# Patient Record
Sex: Female | Born: 1980 | Race: White | Hispanic: No | Marital: Married | Smoking: Former smoker
Health system: Southern US, Community
[De-identification: ages and names within clinical notes are randomized; demographics above are authoritative.]

## PROBLEM LIST (undated history)

## (undated) DIAGNOSIS — I499 Cardiac arrhythmia, unspecified: Secondary | ICD-10-CM

## (undated) DIAGNOSIS — E039 Hypothyroidism, unspecified: Secondary | ICD-10-CM

## (undated) DIAGNOSIS — K219 Gastro-esophageal reflux disease without esophagitis: Secondary | ICD-10-CM

## (undated) DIAGNOSIS — J45909 Unspecified asthma, uncomplicated: Secondary | ICD-10-CM

## (undated) DIAGNOSIS — D649 Anemia, unspecified: Secondary | ICD-10-CM

## (undated) DIAGNOSIS — E785 Hyperlipidemia, unspecified: Secondary | ICD-10-CM

## (undated) HISTORY — PX: NO PAST SURGERIES: SHX2092

---

## 2016-04-10 NOTE — L&D Delivery Note (Signed)
Delivery Note  First Stage: Labor onset: 10/1 at 0600 Augmentation : pitocin Analgesia /Anesthesia intrapartum: epidural AROM at 0054 - thick particulate MSF (amnioinfusion x1 liter)  Second Stage: Complete dilation at 1141 Onset of pushing at  1200 FHR second stage category 2  Delivery of a viable female at 85 by CNM in LOA position Loose single nuchal cord reduced over head at birth Cord double clamped after cessation of pulsation, cut by FOB Cord blood sample collected    Third Stage: Placenta delivered [facility] intact with 3 VC @ 1316 Placenta disposition: disposal Uterine tone firm / bleeding moderate  Bilateral labial lacerations and right sulcus vaginal laceration identified  Anesthesia for repair: epidural Repair 3-0 vicryl for vaginal repair Est. Blood Loss (mL): 400 with amnioinfusion mixed with blood  Complications: none  Mom to postpartum.  Baby to Couplet care / Skin to Skin.  Newborn: Birth Weight: 7-10 Apgar Scores: 8-9 Feeding planned:breast  Madison Blake CNM, MSN, FACNM 01/10/2017, 1:55 PM

## 2016-05-17 LAB — OB RESULTS CONSOLE ANTIBODY SCREEN: ANTIBODY SCREEN: NEGATIVE

## 2016-05-17 LAB — OB RESULTS CONSOLE HIV ANTIBODY (ROUTINE TESTING): HIV: NONREACTIVE

## 2016-05-17 LAB — OB RESULTS CONSOLE ABO/RH: RH TYPE: POSITIVE

## 2016-05-17 LAB — OB RESULTS CONSOLE RUBELLA ANTIBODY, IGM: Rubella: IMMUNE

## 2016-05-17 LAB — OB RESULTS CONSOLE GC/CHLAMYDIA
Chlamydia: NEGATIVE
Gonorrhea: NEGATIVE

## 2016-05-17 LAB — OB RESULTS CONSOLE HEPATITIS B SURFACE ANTIGEN: HEP B S AG: NEGATIVE

## 2016-05-17 LAB — OB RESULTS CONSOLE GBS: STREP GROUP B AG: NEGATIVE

## 2016-05-17 LAB — OB RESULTS CONSOLE RPR: RPR: NONREACTIVE

## 2016-06-30 ENCOUNTER — Other Ambulatory Visit (HOSPITAL_COMMUNITY): Payer: Self-pay

## 2016-06-30 DIAGNOSIS — Z3402 Encounter for supervision of normal first pregnancy, second trimester: Secondary | ICD-10-CM

## 2016-07-03 ENCOUNTER — Other Ambulatory Visit (HOSPITAL_COMMUNITY): Payer: Self-pay

## 2016-07-03 DIAGNOSIS — Z3402 Encounter for supervision of normal first pregnancy, second trimester: Secondary | ICD-10-CM

## 2016-07-19 ENCOUNTER — Other Ambulatory Visit (HOSPITAL_COMMUNITY): Payer: Self-pay

## 2016-07-19 ENCOUNTER — Ambulatory Visit (HOSPITAL_COMMUNITY)
Admission: RE | Admit: 2016-07-19 | Discharge: 2016-07-19 | Disposition: A | Discharge status: Home / Self Care | Payer: Self-pay | Source: Ambulatory Visit

## 2016-07-19 DIAGNOSIS — Z3402 Encounter for supervision of normal first pregnancy, second trimester: Secondary | ICD-10-CM

## 2016-07-19 DIAGNOSIS — Z3A16 16 weeks gestation of pregnancy: Secondary | ICD-10-CM | POA: Insufficient documentation

## 2016-07-19 DIAGNOSIS — O4402 Placenta previa specified as without hemorrhage, second trimester: Secondary | ICD-10-CM | POA: Insufficient documentation

## 2016-07-19 DIAGNOSIS — Z3A18 18 weeks gestation of pregnancy: Secondary | ICD-10-CM

## 2016-07-19 DIAGNOSIS — Z362 Encounter for other antenatal screening follow-up: Secondary | ICD-10-CM | POA: Insufficient documentation

## 2016-07-19 DIAGNOSIS — O288 Other abnormal findings on antenatal screening of mother: Secondary | ICD-10-CM | POA: Insufficient documentation

## 2016-07-19 DIAGNOSIS — Q046 Congenital cerebral cysts: Secondary | ICD-10-CM | POA: Insufficient documentation

## 2016-07-20 ENCOUNTER — Other Ambulatory Visit (HOSPITAL_COMMUNITY): Payer: Self-pay | Admitting: *Deleted

## 2016-07-20 DIAGNOSIS — Z0489 Encounter for examination and observation for other specified reasons: Secondary | ICD-10-CM

## 2016-07-20 DIAGNOSIS — IMO0002 Reserved for concepts with insufficient information to code with codable children: Secondary | ICD-10-CM

## 2016-07-21 ENCOUNTER — Ambulatory Visit (HOSPITAL_COMMUNITY): Payer: Self-pay

## 2016-08-16 ENCOUNTER — Ambulatory Visit (HOSPITAL_COMMUNITY): Payer: Self-pay

## 2017-01-09 ENCOUNTER — Encounter (HOSPITAL_COMMUNITY): Payer: Self-pay

## 2017-01-09 ENCOUNTER — Inpatient Hospital Stay (HOSPITAL_COMMUNITY)
Admission: AD | Admit: 2017-01-09 | Discharge: 2017-01-11 | DRG: 807 | Disposition: A | Discharge status: Home / Self Care | Payer: Self-pay | Source: Ambulatory Visit | Attending: Obstetrics and Gynecology | Admitting: Obstetrics and Gynecology

## 2017-01-09 DIAGNOSIS — Z3A41 41 weeks gestation of pregnancy: Secondary | ICD-10-CM

## 2017-01-09 DIAGNOSIS — Z87891 Personal history of nicotine dependence: Secondary | ICD-10-CM

## 2017-01-09 MED ORDER — LACTATED RINGERS IV SOLN
500.0000 mL | Freq: Once | INTRAVENOUS | Status: AC
  Administered 2017-01-10: 500 mL via INTRAVENOUS

## 2017-01-09 MED ORDER — FAMOTIDINE IN NACL 20-0.9 MG/50ML-% IV SOLN: 20.0000 mg | Freq: Once | INTRAVENOUS | Status: DC

## 2017-01-09 MED ORDER — EPHEDRINE 5 MG/ML INJ
10.0000 mg | INTRAVENOUS | Status: DC | PRN
  Filled 2017-01-09: qty 2

## 2017-01-09 MED ORDER — OXYTOCIN BOLUS FROM INFUSION
500.0000 mL | Freq: Once | INTRAVENOUS | Status: AC
  Administered 2017-01-10: 500 mL via INTRAVENOUS

## 2017-01-09 MED ORDER — LACTATED RINGERS IV SOLN
INTRAVENOUS | Status: DC
  Administered 2017-01-10: via INTRAVENOUS

## 2017-01-09 MED ORDER — ACETAMINOPHEN 325 MG PO TABS
650.0000 mg | ORAL_TABLET | ORAL | Status: DC | PRN
Start: 2017-01-09 — End: 2017-01-10
  Administered 2017-01-10: 650 mg via ORAL
  Filled 2017-01-09: qty 2

## 2017-01-09 MED ORDER — SOD CITRATE-CITRIC ACID 500-334 MG/5ML PO SOLN: 30.0000 mL | ORAL | Status: DC | PRN

## 2017-01-09 MED ORDER — PHENYLEPHRINE 40 MCG/ML (10ML) SYRINGE FOR IV PUSH (FOR BLOOD PRESSURE SUPPORT)
80.0000 ug | PREFILLED_SYRINGE | INTRAVENOUS | Status: DC | PRN
  Filled 2017-01-09 (×2): qty 10
  Filled 2017-01-09: qty 5

## 2017-01-09 MED ORDER — FENTANYL 2.5 MCG/ML BUPIVACAINE 1/10 % EPIDURAL INFUSION (WH - ANES)
14.0000 mL/h | INTRAMUSCULAR | Status: DC | PRN
  Administered 2017-01-10 (×2): 14 mL/h via EPIDURAL
  Filled 2017-01-09 (×2): qty 100

## 2017-01-09 MED ORDER — LIDOCAINE HCL (PF) 1 % IJ SOLN
30.0000 mL | INTRAMUSCULAR | Status: DC | PRN
  Filled 2017-01-09: qty 30

## 2017-01-09 MED ORDER — OXYCODONE-ACETAMINOPHEN 5-325 MG PO TABS
2.0000 | ORAL_TABLET | ORAL | Status: DC | PRN
Start: 2017-01-09 — End: 2017-01-10

## 2017-01-09 MED ORDER — PHENYLEPHRINE 40 MCG/ML (10ML) SYRINGE FOR IV PUSH (FOR BLOOD PRESSURE SUPPORT)
80.0000 ug | PREFILLED_SYRINGE | INTRAVENOUS | Status: DC | PRN
  Administered 2017-01-10: 80 ug via INTRAVENOUS
  Filled 2017-01-09: qty 5

## 2017-01-09 MED ORDER — OXYCODONE-ACETAMINOPHEN 5-325 MG PO TABS: 1.0000 | ORAL_TABLET | ORAL | Status: DC | PRN

## 2017-01-09 MED ORDER — OXYTOCIN 40 UNITS IN LACTATED RINGERS INFUSION - SIMPLE MED
2.5000 [IU]/h | INTRAVENOUS | Status: DC
  Filled 2017-01-09: qty 1000

## 2017-01-09 MED ORDER — LACTATED RINGERS IV SOLN: 500.0000 mL | INTRAVENOUS | Status: DC | PRN

## 2017-01-09 MED ORDER — DIPHENHYDRAMINE HCL 50 MG/ML IJ SOLN: 12.5000 mg | INTRAMUSCULAR | Status: DC | PRN

## 2017-01-09 MED ORDER — OXYTOCIN 10 UNIT/ML IJ SOLN: 10.0000 [IU] | Freq: Once | INTRAMUSCULAR | Status: DC

## 2017-01-10 ENCOUNTER — Inpatient Hospital Stay (HOSPITAL_COMMUNITY): Payer: Self-pay | Admitting: Anesthesiology

## 2017-01-10 ENCOUNTER — Encounter (HOSPITAL_COMMUNITY): Payer: Self-pay

## 2017-01-10 LAB — CBC
HCT: 33.1 % — ABNORMAL LOW (ref 36.0–46.0)
Hemoglobin: 11.6 g/dL — ABNORMAL LOW (ref 12.0–15.0)
MCH: 29.8 pg (ref 26.0–34.0)
MCHC: 35 g/dL (ref 30.0–36.0)
MCV: 85.1 fL (ref 78.0–100.0)
Platelets: 155 10*3/uL (ref 150–400)
RBC: 3.89 MIL/uL (ref 3.87–5.11)
RDW: 15 % (ref 11.5–15.5)
WBC: 12.1 10*3/uL — ABNORMAL HIGH (ref 4.0–10.5)

## 2017-01-10 LAB — TYPE AND SCREEN
ABO/RH(D): O POS
Antibody Screen: NEGATIVE

## 2017-01-10 LAB — RPR: RPR Ser Ql: NONREACTIVE

## 2017-01-10 LAB — ABO/RH: ABO/RH(D): O POS

## 2017-01-10 MED ORDER — DIBUCAINE 1 % RE OINT: 1.0000 "application " | TOPICAL_OINTMENT | RECTAL | Status: DC | PRN

## 2017-01-10 MED ORDER — TERBUTALINE SULFATE 1 MG/ML IJ SOLN
0.2500 mg | Freq: Once | INTRAMUSCULAR | Status: DC | PRN
  Filled 2017-01-10: qty 1

## 2017-01-10 MED ORDER — SENNOSIDES-DOCUSATE SODIUM 8.6-50 MG PO TABS
2.0000 | ORAL_TABLET | ORAL | Status: DC
  Administered 2017-01-11: 2 via ORAL
  Filled 2017-01-10: qty 2

## 2017-01-10 MED ORDER — BENZOCAINE-MENTHOL 20-0.5 % EX AERO
1.0000 "application " | INHALATION_SPRAY | CUTANEOUS | Status: DC | PRN
  Administered 2017-01-10: 1 via TOPICAL
  Filled 2017-01-10: qty 56

## 2017-01-10 MED ORDER — DIPHENHYDRAMINE HCL 25 MG PO CAPS: 25.0000 mg | ORAL_CAPSULE | Freq: Four times a day (QID) | ORAL | Status: DC | PRN

## 2017-01-10 MED ORDER — LIDOCAINE HCL (PF) 1 % IJ SOLN
INTRAMUSCULAR | Status: DC | PRN
  Administered 2017-01-10 (×2): 5 mL via EPIDURAL

## 2017-01-10 MED ORDER — LACTATED RINGERS IV SOLN
INTRAVENOUS | Status: DC
  Administered 2017-01-10: 08:00:00 via INTRAUTERINE

## 2017-01-10 MED ORDER — ACETAMINOPHEN 325 MG PO TABS: 650.0000 mg | ORAL_TABLET | ORAL | Status: DC | PRN

## 2017-01-10 MED ORDER — COCONUT OIL OIL: 1.0000 "application " | TOPICAL_OIL | Status: DC | PRN

## 2017-01-10 MED ORDER — OXYTOCIN 40 UNITS IN LACTATED RINGERS INFUSION - SIMPLE MED
1.0000 m[IU]/min | INTRAVENOUS | Status: DC
  Administered 2017-01-10: 2 m[IU]/min via INTRAVENOUS

## 2017-01-10 MED ORDER — SIMETHICONE 80 MG PO CHEW: 80.0000 mg | CHEWABLE_TABLET | ORAL | Status: DC | PRN

## 2017-01-10 MED ORDER — IBUPROFEN 600 MG PO TABS
600.0000 mg | ORAL_TABLET | Freq: Four times a day (QID) | ORAL | Status: DC
  Administered 2017-01-10 – 2017-01-11 (×4): 600 mg via ORAL
  Filled 2017-01-10 (×4): qty 1

## 2017-01-10 MED ORDER — WITCH HAZEL-GLYCERIN EX PADS: 1.0000 "application " | MEDICATED_PAD | CUTANEOUS | Status: DC | PRN

## 2017-01-10 NOTE — Anesthesia Preprocedure Evaluation (Signed)
Anesthesia Evaluation  Patient identified by MRN, date of birth, ID band Patient awake    Reviewed: Allergy & Precautions, NPO status , Patient's Chart, lab work & pertinent test results  History of Anesthesia Complications Negative for: history of anesthetic complications  Airway Mallampati: II  TM Distance: >3 FB Neck ROM: Full    Dental no notable dental hx. (+) Dental Advisory Given   Pulmonary former smoker,    Pulmonary exam normal        Cardiovascular negative cardio ROS Normal cardiovascular exam     Neuro/Psych negative neurological ROS  negative psych ROS   GI/Hepatic negative GI ROS, Neg liver ROS,   Endo/Other  negative endocrine ROS  Renal/GU negative Renal ROS  negative genitourinary   Musculoskeletal negative musculoskeletal ROS (+)   Abdominal   Peds negative pediatric ROS (+)  Hematology negative hematology ROS (+)   Anesthesia Other Findings   Reproductive/Obstetrics (+) Pregnancy                             Anesthesia Physical Anesthesia Plan  ASA: II  Anesthesia Plan: Epidural   Post-op Pain Management:    Induction:   PONV Risk Score and Plan:   Airway Management Planned: Natural Airway  Additional Equipment:   Intra-op Plan:   Post-operative Plan:   Informed Consent: I have reviewed the patients History and Physical, chart, labs and discussed the procedure including the risks, benefits and alternatives for the proposed anesthesia with the patient or authorized representative who has indicated his/her understanding and acceptance.   Dental advisory given  Plan Discussed with: Anesthesiologist  Anesthesia Plan Comments:         Anesthesia Quick Evaluation

## 2017-01-10 NOTE — Progress Notes (Signed)
S:  Feeling pressure with ctx  O:  VS: Blood pressure 98/68, pulse 60, temperature 98 F (36.7 C), temperature source Oral, resp. rate 18, height  (1.702 m), weight 76.2 kg (168 lb), SpO2 99 %.        FHR : baseline 125 / variability moderate / accelerations + / no decelerations        Toco: contractions every 2-4 minutes / pitocin 10 mu/min / MVU 180        Cervix : 10cm / 100% / vtx +2        Membranes: thin green fluid - good response from amnioinfusion        Foley - clear straw urine  A: second stage of labor     FHR category 1  P: start active second stage - anticipate SVB      amioinfusion discontinued      Dr Billy Coast updated   Madison Blake CNM, MSN, [facility] 01/10/2017, 11:59 AM

## 2017-01-10 NOTE — Anesthesia Pain Management Evaluation Note (Signed)
  CRNA Pain Management Visit Note  Patient: Madison Blake, 36 y.o., female  "Hello I am a member of the anesthesia team at [facility]. We have an anesthesia team available at all times to provide care throughout the hospital, including epidural management and anesthesia for C-section. I don't know your plan for the delivery whether it a natural birth, water birth, IV sedation, nitrous supplementation, doula or epidural, but we want to meet your pain goals."   1.Was your pain managed to your expectations on prior hospitalizations?   No prior hospitalizations  2.What is your expectation for pain management during this hospitalization?     Epidural  3.How can we help you reach that goal? unsure  Record the patient's initial score and the patient's pain goal.   Pain: 0  Pain Goal: 10 [facility] wants you to be able to say your pain was always managed very well.  Cephus Shelling 01/10/2017

## 2017-01-10 NOTE — Progress Notes (Signed)
S:  comfortable with epidural  O:  VS: Blood pressure 105/64, pulse (!) 58, temperature 98.4 F (36.9 C), temperature source Oral, resp. rate 17, height  (1.702 m), weight 76.2 kg (168 lb), SpO2 99 %.        FHR : baseline 125 / variability moderate / accelerations + / no decelerations        Toco: contractions every 2-6 minutes / moderate to strong         Cervix : 5-6cm / 90% / vtx 0 station        Membranes: BBOW - AROM with moderate thick, green MSF  A: prolonged labor     FHR category 1  P: expectant management       foley      rotate maternal position on peanut - encouraged sleep/rest for few hours until pressure       re-evaluate in 3-4 hours   Marlinda Mike CNM, MSN, [facility] 01/10/2017, 12:59 AM

## 2017-01-10 NOTE — Anesthesia Procedure Notes (Signed)
Epidural Patient location during procedure: OB Start time: 01/10/2017 12:25 AM End time: 01/10/2017 12:48 AM  Staffing Anesthesiologist: Heather Roberts Performed: anesthesiologist   Preanesthetic Checklist Completed: patient identified, site marked, pre-op evaluation, timeout performed, IV checked, risks and benefits discussed and monitors and equipment checked  Epidural Patient position: sitting Prep: DuraPrep Patient monitoring: heart rate, cardiac monitor, continuous pulse ox and blood pressure Approach: midline Location: L2-L3 Injection technique: LOR saline  Needle:  Needle type: Tuohy  Needle gauge: 17 G Needle length: 9 cm Needle insertion depth: 5 cm Catheter size: 20 Guage Catheter at skin depth: 10 cm Test dose: negative and Other  Assessment Events: blood not aspirated, injection not painful, no injection resistance and negative IV test  Additional Notes Informed consent obtained prior to proceeding including risk of failure, 1% risk of PDPH, risk of minor discomfort and bruising.  Discussed rare but serious complications including epidural abscess, permanent nerve injury, epidural hematoma.  Discussed alternatives to epidural analgesia and patient desires to proceed.  Timeout performed pre-procedure verifying patient name, procedure, and platelet count.  Patient tolerated procedure well.

## 2017-01-10 NOTE — Anesthesia Postprocedure Evaluation (Signed)
Anesthesia Post Note  Patient: Madison Blake  Procedure(s) Performed: AN AD HOC LABOR EPIDURAL     Patient location during evaluation: Mother Baby Anesthesia Type: Epidural Level of consciousness: awake and alert and oriented Pain management: pain level controlled Vital Signs Assessment: post-procedure vital signs reviewed and stable Respiratory status: spontaneous breathing and nonlabored ventilation Cardiovascular status: stable Postop Assessment: no headache, patient able to bend at knees, no backache, no apparent nausea or vomiting, epidural receding and adequate PO intake Anesthetic complications: no    Last Vitals:  Vitals:   01/10/17 1446 01/10/17 1522  BP: 118/66 105/61  Pulse: (!) 58 62  Resp: 18 18  Temp:  37.4 C  SpO2:      Last Pain:  Vitals:   01/10/17 1636  TempSrc:   PainSc: 2    Pain Goal: Patients Stated Pain Goal: 2 (01/09/17 2355)               Laban Emperor

## 2017-01-10 NOTE — H&P (Signed)
  OB ADMISSION/ HISTORY & PHYSICAL:  Admission Date: 01/09/2017 11:39 PM  Admit Diagnosis: 41.2 with prolonged latent labor >24 hours  Madison Blake is a 36 y.o. female presenting for prolonged labor with maternal fatigue - desires epidural.  Prenatal History: G1P0   EDC : 12/31/2016, by Other Basis  Prenatal care at [facility]  Primary Ob Provider: Kathi Ludwig Prenatal course uncomplicated  Prenatal Labs: ABO, Rh:  O positive Antibody:  negative Rubella: positive RPR:   NR HBsAg:   negative HIV:   NR GTT: nl GBS:     Medical / Surgical History :  Past medical history: History reviewed. No pertinent past medical history.   Past surgical history:  Past Surgical History:  Procedure Laterality Date  . NO PAST SURGERIES     Family History:  Family History  Problem Relation Age of Onset  . Asthma Mother   . Cancer Maternal Grandmother   . Hypertension Maternal Grandfather     Social History:  reports that she quit smoking about 11 years ago. Her smoking use included Cigarettes. She has quit using smokeless tobacco. She reports that she does not drink alcohol or use drugs.  Allergies: Patient has no known allergies.   Current Medications at time of admission:  Prior to Admission medications   Medication Sig Start Date End Date Taking? Authorizing Provider  diphenhydrAMINE (SOMINEX) 25 MG tablet Take 25 mg by mouth at bedtime as needed for sleep.   Yes [provider]  hydrOXYzine (ATARAX/VISTARIL) 50 MG tablet Take 50 mg by mouth 3 (three) times daily as needed.   Yes [provider]  Prenatal Vit-Fe Fumarate-FA (PRENATAL MULTIVITAMIN) TABS tablet Take 1 tablet by mouth daily at 12 noon.   Yes [provider]  ranitidine (ZANTAC) 300 MG tablet Take 300 mg by mouth at bedtime.   Yes [provider]   Review of Systems: Active FM onset of ctx Monday AM 0600 currently every 2-5 minutes Membranes intact bloody show  present   Physical Exam: VS: General: alert and oriented, appears exhausted and pale Heart: RRR Lungs: Clear lung fields Abdomen: Gravid, soft and non-tender, non-distended / uterus: gravid Extremities: no edema  Genitalia / VE:  5cm / 90% vtx 0 at [facility]  FHR: baseline rate 125 / variability moderate / accelerations + / no decelerations TOCO: Q8-10 x 120 seconds / moderate  Assessment: 41.[redacted]  weeks gestation Prolonged latent stage of labor FHR category 1   Plan:  Admit Epidural AROM Augment as needed  Dr Billy Coast notified of admission / plan of care   Marlinda Mike CNM, MSN, [facility] 01/10/2017, 12:07 AM

## 2017-01-10 NOTE — Progress Notes (Signed)
S:  No pain or pressure - slept some  O:  VS: Blood pressure (!) 102/41, pulse 62, temperature 98.5 F (36.9 C), temperature source Oral, resp. rate 16, height  (1.702 m), weight 76.2 kg (168 lb), SpO2 99 %.        FHR : baseline 125 / variability moderate / accelerations 10x10 / no decelerations        Toco: contractions every 2-4 minutes         Cervix : 5-6/90 / vtx / 0 station        Membranes: thick MSF        IUPC placed - MVU inadequate <150        Foley draining clear straw colored urine ~ 500 in bag  A: prolonged labor with inadequate contraction     MSF     FHR category 1  P: discussed contractions and MSF - recommend amnioinfusion and pitocin to attempt to facilitate adequate contractions      risks and benefits reviewed including potential for fetal stress and uterine fatigue related to prolonged labor and augment      If no progression in cervical dilation or descent, will need cesarean section regardless of augmentation      reviewed management of MSF                 - rationale of thinning particulate meconium for fetus, uterine contractile ability, PP implications for infection      desires to proceed with augmentation plan      Dr Billy Coast updated with status     Madison Blake CNM, MSN, [facility] 01/10/2017, 7:18 AM

## 2017-01-11 LAB — CBC
HCT: 28.7 % — ABNORMAL LOW (ref 36.0–46.0)
Hemoglobin: 10.2 g/dL — ABNORMAL LOW (ref 12.0–15.0)
MCH: 30.4 pg (ref 26.0–34.0)
MCHC: 35.5 g/dL (ref 30.0–36.0)
MCV: 85.7 fL (ref 78.0–100.0)
Platelets: 183 10*3/uL (ref 150–400)
RBC: 3.35 MIL/uL — ABNORMAL LOW (ref 3.87–5.11)
RDW: 15.3 % (ref 11.5–15.5)
WBC: 9.8 10*3/uL (ref 4.0–10.5)

## 2017-01-11 MED ORDER — IBUPROFEN 600 MG PO TABS: 600.0000 mg | ORAL_TABLET | Freq: Four times a day (QID) | ORAL | 0 refills | Status: AC

## 2017-01-11 NOTE — Discharge Summary (Signed)
Obstetric Discharge Summary Reason for Admission: labor augmentation - prolonged labor Prenatal Procedures: NST and ultrasound Intrapartum Procedures: spontaneous vaginal delivery and epidural Postpartum Procedures: none Complications-Operative and Postpartum: vaginal laceration and bilateral labial - repaired Hemoglobin  Date Value Ref Range Status  01/11/2017 10.2 (L) 12.0 - 15.0 g/dL Final   HCT  Date Value Ref Range Status  01/11/2017 28.7 (L) 36.0 - 46.0 % Final    Physical Exam:  General: alert, cooperative and no distress Lochia: appropriate Uterine Fundus: firm DVT Evaluation: No evidence of DVT seen on physical exam.  Discharge Diagnoses: Term Pregnancy-delivered  Discharge Information: Date: 01/11/2017 Activity: pelvic rest Diet: routine Medications: PNV and Ibuprofen Condition: stable Instructions: refer to practice specific booklet Discharge to: home Follow-up Information    Marlinda Mike, CNM. Schedule an appointment as soon as possible for a visit in 2 week(s).   Specialty:  Obstetrics and Gynecology Contact information: [address] [place] [address] [phone]           Newborn Data: Live born female  Birth Weight: 7 lb 10.1 oz (3460 g) APGAR: 8, 9  Newborn Delivery   Birth date/time:  01/10/2017 13:12:00 Delivery type:  Vaginal, Spontaneous Delivery      Home with mother.  Marlinda Mike 01/11/2017, 9:42 AM

## 2017-01-11 NOTE — Lactation Note (Signed)
This note was copied from a baby's chart. Lactation Consultation Note: Lactation Consultant brochure given . Basic breastfeeding teaching done. Infant is 35 hours old and has had multiple feedings. Mother denies having any discomfort with latching, Mother describes good flow of colostrum when hand expresses. Mother advised to cue base feed and feed infant 8-12 times in 24 hours. Mother advised to do frequent skin to skin . Lots of basic teaching. Infant was just fed and is sleeping in family members arms. Mother advised to call for latch assist with the next feeding. Discussed cluster feeding. Mother was informed of available LC services and community support.   Patient Name: Madison Blake Today's Date: 01/11/2017 Reason for consult: Initial assessment   Maternal Data Has patient been taught Hand Expression?: Yes Does the patient have breastfeeding experience prior to this delivery?: No  Feeding Feeding Type: Breast Fed Length of feed: 15 min  LATCH Score                   Interventions Interventions: Breast feeding basics reviewed;Skin to skin;Breast compression;Position options;Expressed milk  Lactation Tools Discussed/Used Initiated by:: Staff Nurse Date initiated:: 01/11/17   Consult Status Consult Status: Follow-up Date: 01/11/17 Follow-up type: In-patient    Stevan Born [facility] 01/11/2017, 12:09 PM

## 2017-01-11 NOTE — Discharge Instructions (Signed)
[name] verbalizes understanding of d/c instructions. Will call for F/U appointment.  Baby F/U for tomorrow am.

## 2017-01-11 NOTE — Progress Notes (Signed)
PPD 1 SVD with vaginal repair  S:  Reports feeling well - wants early DC             Tolerating po/ No nausea or vomiting             Bleeding is light             Pain controlled with Motrin only             Up ad lib / ambulatory / voiding QS  Newborn breast feeding  / female O:               VS: BP 114/74 (BP Location: Left Arm)   Pulse (!) 59   Temp 98.4 F (36.9 C) (Oral)   Resp 18   Ht  (1.702 m)   Wt 76.2 kg (168 lb)   SpO2 99%   Breastfeeding? Unknown   BMI 26.31 kg/m    LABS:              Recent Labs  01/10/17 0009 01/11/17 0544  WBC 12.1* 9.8  HGB 11.6* 10.2*  PLT 155 183               Blood type: --/--/O POS, O POS (10/03 0009)  Rubella: Immune                    I&O: Intake/Output      10/03 0701 - 10/04 0700 10/04 0701 - 10/05 0700   Urine (mL/kg/hr) 775 (0.4)    Blood 400    Total Output 1175     Net -1175          Urine Occurrence 1 x                  Physical Exam:             Alert and oriented X3  Abdomen: soft, non-tender, non-distended              Fundus: firm, non-tender, U-1  Perineum: mild edema - ice pack in place  Lochia: light  Extremities: no edema, no calf pain or tenderness    A: PPD # 1 SVD   Doing well - stable status  P: Routine post partum orders  DC Mom - awaiting bilirubin level for Peds DC decision  Marlinda Mike CNM, MSN, [facility] 01/11/2017, 9:37 AM

## 2017-01-22 ENCOUNTER — Telehealth (HOSPITAL_COMMUNITY): Payer: Self-pay | Admitting: Lactation Services

## 2018-04-10 DIAGNOSIS — F419 Anxiety disorder, unspecified: Secondary | ICD-10-CM

## 2018-04-10 HISTORY — DX: Anxiety disorder, unspecified: F41.9

## 2018-05-27 IMAGING — US US MFM OB COMP +14 WKS
1 series · 13 of 28 positions shown · non-contrast
Comparison: none

[Series 1: us mfm ob comp +14 wks · 121 acquisitions, 13 frames shown]
[im 5/121]
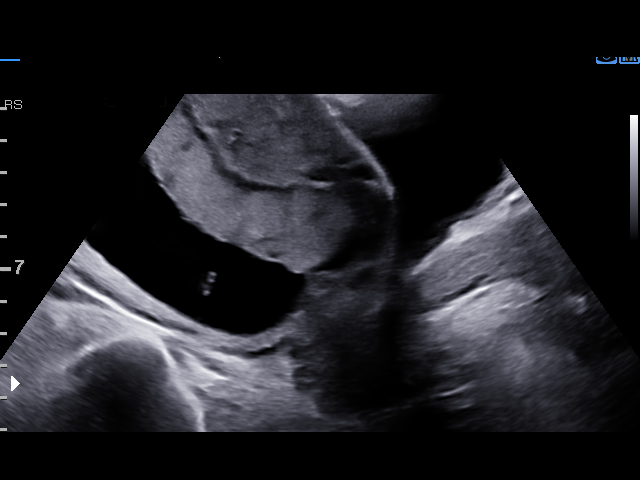
[im 14/121]
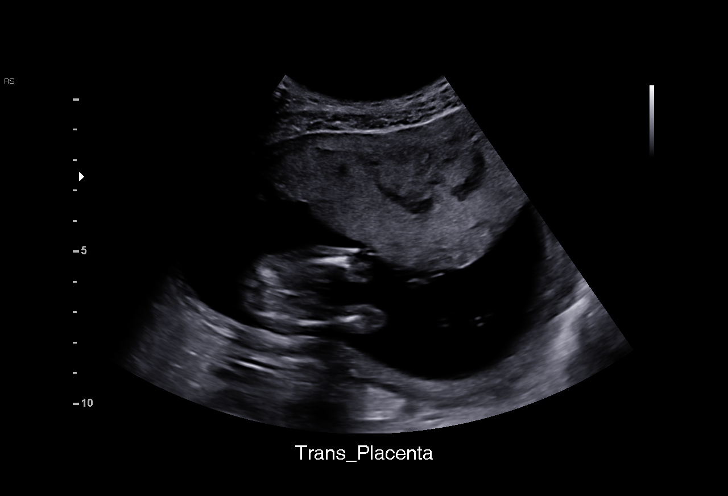
[im 23/121]
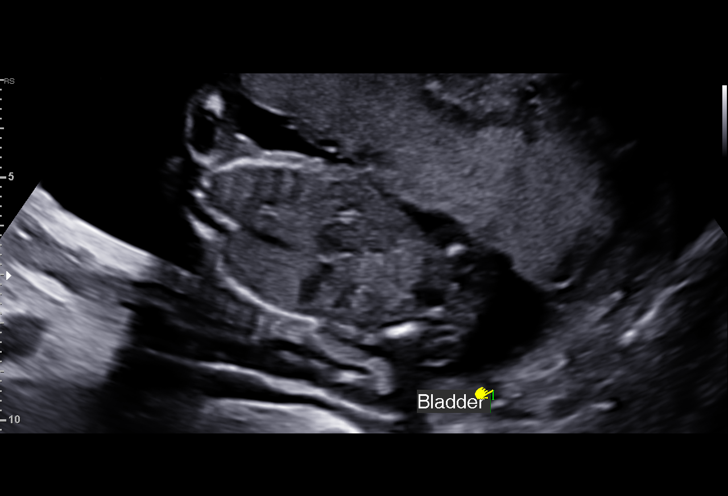
[im 32/121]
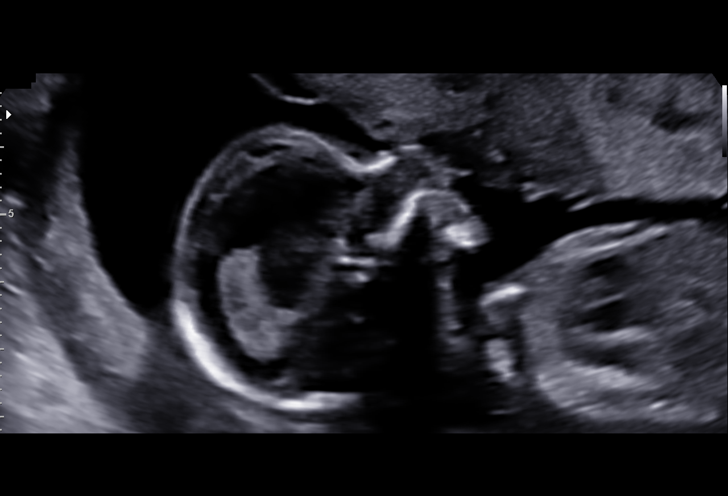
[im 41/121]
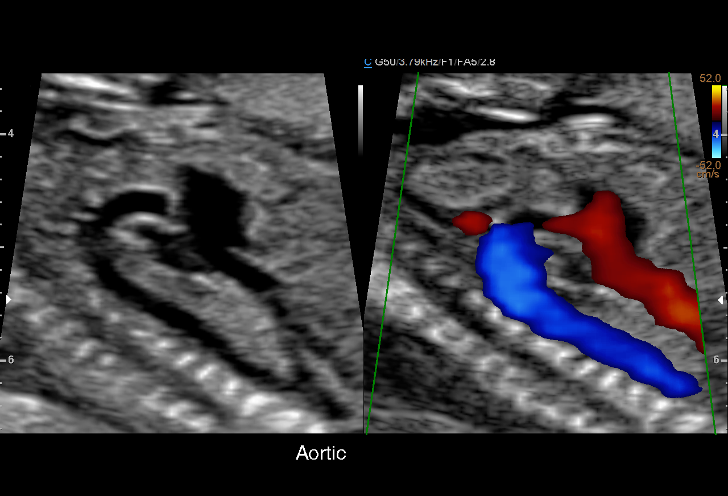
[im 49/121]
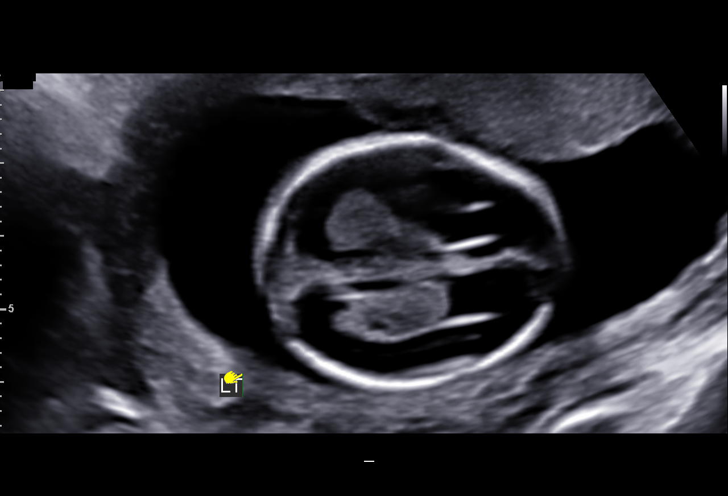
[im 63/121]
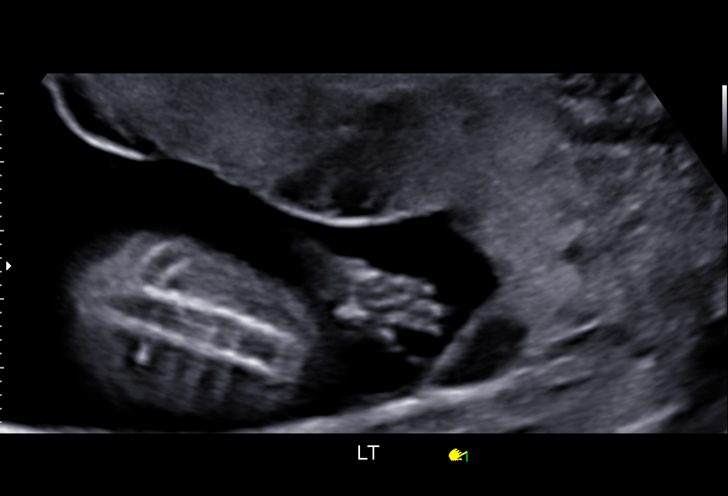
[im 72/121]
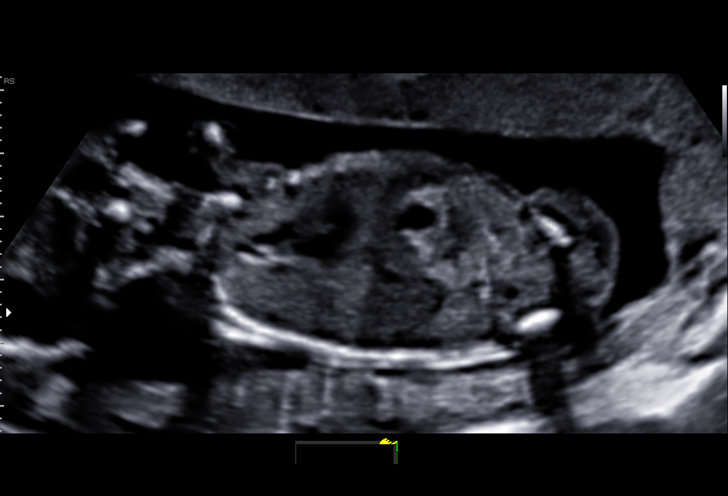
[im 81/121]
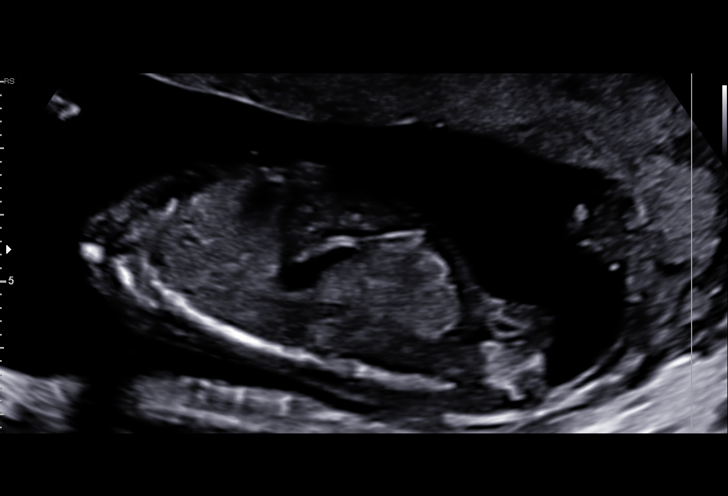
[im 89/121]
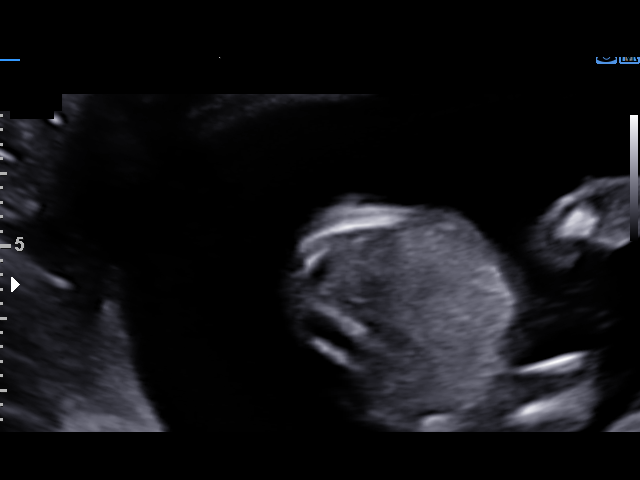
[im 98/121]
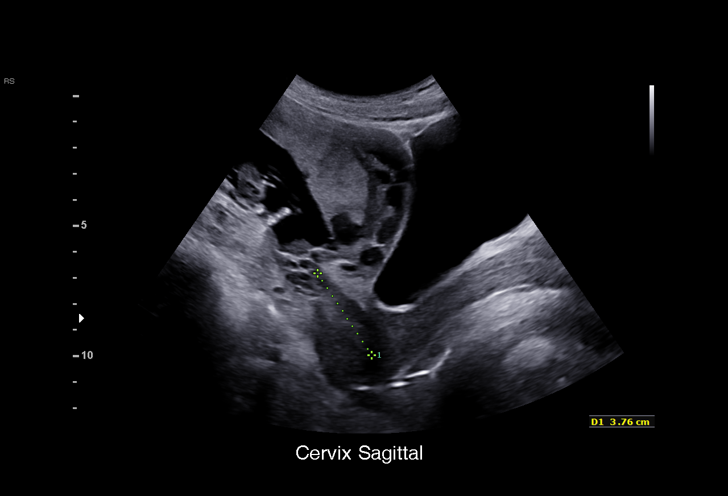
[im 107/121]
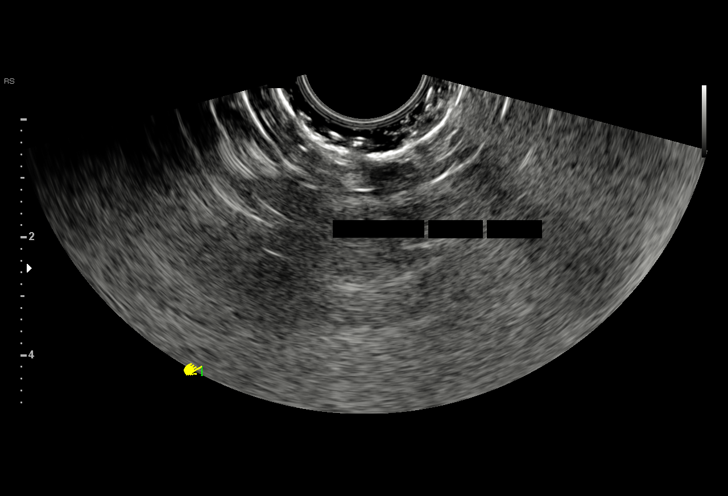
[im 116/121]
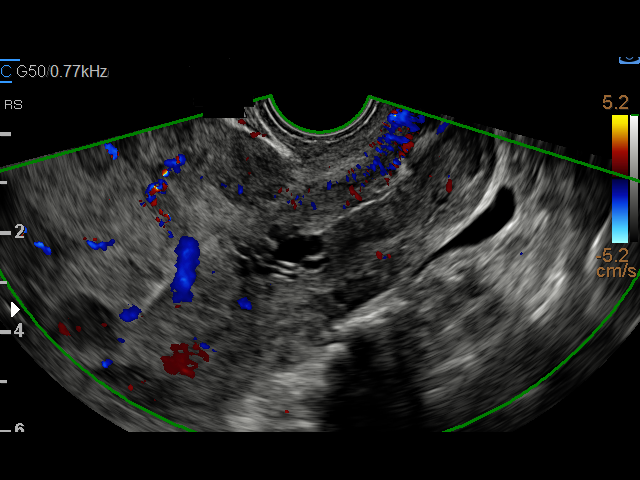

[13 of 28 positions shown; findings below may reference images not displayed]

Center

1  JORGELU[facility]             [id number]      [id number]     [id number]
2  JORGELUIS BURFORD             [id number]      [id number]     [id number]
Indications

16 weeks gestation of pregnancy
Encounter for antenatal screening for
malformations
Late prenatal care, second trimester
OB History

Gravidity:    1
Fetal Evaluation

Num Of Fetuses:     1
Fetal Heart         143
Rate(bpm):
Cardiac Activity:   Observed
Presentation:       Variable
Placenta:           Anterior previa
P. Cord Insertion:  Visualized, central

Amniotic Fluid
AFI FV:      Subjectively within normal limits

Largest Pocket(cm)
5.05
Biometry

BPD:      34.7  mm     G. Age:  16w 5d         48  %    CI:        71.66   %   70 - 86
FL/HC:      16.3   %   14.6 -
HC:      130.5  mm     G. Age:  16w 5d         37  %    HC/AC:      1.19       1.07 -
AC:       110   mm     G. Age:  16w 6d         55  %    FL/BPD:     61.4   %
FL:       21.3  mm     G. Age:  16w 3d         32  %    FL/AC:      19.4   %   20 - 24
HUM:      20.3  mm     G. Age:  16w 0d         37  %
CER:      16.5  mm     G. Age:  16w 4d         45  %
NFT:       3.3  mm
CM:          3  mm

Est. FW:     163  gm      0 lb 6 oz     56  %
Gestational Age

LMP:           18w 3d       Date:   03/12/16                 EDD:   12/17/16
U/S Today:     16w 5d                                        EDD:   12/29/16
Best:          16w 5d    Det. By:   U/S (07/19/16)           EDD:   12/29/16
Anatomy

Cranium:               Appears normal         Aortic Arch:            Appears normal
Cavum:                 Appears normal         Ductal Arch:            Appears normal
Ventricles:            Appears normal         Diaphragm:              Appears normal
Choroid Plexus:        Bilateral choroid      Stomach:                Appears normal, left
plexus cysts
sided
Cerebellum:            Appears normal         Abdomen:                Echogenic Bowel
Posterior Fossa:       Appears normal         Abdominal Wall:         Appears nml (cord
insert, abd wall)
Nuchal Fold:           Appears normal         Cord Vessels:           Appears normal (3
vessel cord)
Face:                  Appears normal         Kidneys:                Appear normal
(orbits and profile)
Lips:                  Appears normal         Bladder:                Appears normal
Thoracic:              Appears normal         Spine:                  Appears normal
Heart:                 Not well visualized    Upper Extremities:      Appears normal
RVOT:                  Not well visualized    Lower Extremities:      Appears normal
LVOT:                  Appears normal

Other:  Parents do not wish to know sex of fetus. Heels and 5th digit
visualized. Open hands visualized.
Cervix Uterus Adnexa

Cervix
Length:           3.93  cm.
Normal appearance by transvaginal scan

Uterus
No abnormality visualized.

Left Ovary
Not visualized.

Right Ovary
Not visualized.

Adnexa:       No abnormality visualized. No adnexal mass
visualized.
Comments

Choroid plexus cysts were noted.  This is a common variant
(1-2%) of all pregnancies, and carries a small association
with Trisomy 18.  However, there were no other ultrasound
stigmata of Trisomy 18, making the possibility of Trisomy 18
less likely.
Impression

Single IUP at 16w 5d by ultrasound today
Bilateral choroid plexus cysts were noted
Echogenic bowel is appreciated
Limited views of the fetal heart were obtained due to early
gestational age and fetal position
The remainder of the fetal anatomy appears normal
Anterior placenta previa noted

Ultrasound findings and limitations were reviewed with the
patient.  We reviewed both the findings of choroid plexus
cysts and echogenic bowel.  The differential diagnosis for
echogenic bowel was discussed to include normal anatomic
variant, cystic fibrosis, viral infection (i.e. CMV,
toxoplasmosis) and aneuploidy.  After counseling, the patient
elected to follow up in 4 weeks to reevaluate - if echogenic
bowel is persistant at that time, would recommend further
work up to include cell-free fetal DNA, viral serologies and CF
carrier status.  The patient is aware that further work up is
strictly diagnostic.
Recommendations

Recommend follow-up ultrasound examination in 4 weeks

## 2020-09-16 NOTE — Telephone Encounter (Signed)
See note in contact box 

## 2023-05-01 ENCOUNTER — Other Ambulatory Visit (HOSPITAL_BASED_OUTPATIENT_CLINIC_OR_DEPARTMENT_OTHER): Payer: Self-pay | Admitting: Family Medicine

## 2023-05-01 DIAGNOSIS — R109 Unspecified abdominal pain: Secondary | ICD-10-CM

## 2023-05-01 DIAGNOSIS — N920 Excessive and frequent menstruation with regular cycle: Secondary | ICD-10-CM

## 2023-05-04 ENCOUNTER — Encounter (HOSPITAL_BASED_OUTPATIENT_CLINIC_OR_DEPARTMENT_OTHER): Payer: Self-pay | Admitting: Family Medicine

## 2023-05-07 ENCOUNTER — Inpatient Hospital Stay (HOSPITAL_BASED_OUTPATIENT_CLINIC_OR_DEPARTMENT_OTHER): Admission: RE | Admit: 2023-05-07 | Payer: 59 | Source: Ambulatory Visit | Admitting: Radiology

## 2023-05-07 ENCOUNTER — Other Ambulatory Visit (HOSPITAL_BASED_OUTPATIENT_CLINIC_OR_DEPARTMENT_OTHER): Payer: 59 | Admitting: Radiology

## 2023-05-14 ENCOUNTER — Ambulatory Visit (INDEPENDENT_AMBULATORY_CARE_PROVIDER_SITE_OTHER)
Admission: RE | Admit: 2023-05-14 | Discharge: 2023-05-14 | Disposition: A | Discharge status: Home / Self Care | Payer: 59 | Source: Ambulatory Visit | Attending: Family Medicine | Admitting: Family Medicine

## 2023-05-14 DIAGNOSIS — R102 Pelvic and perineal pain: Secondary | ICD-10-CM

## 2023-05-14 DIAGNOSIS — N920 Excessive and frequent menstruation with regular cycle: Secondary | ICD-10-CM | POA: Diagnosis not present

## 2023-05-14 DIAGNOSIS — R109 Unspecified abdominal pain: Secondary | ICD-10-CM

## 2023-05-18 ENCOUNTER — Telehealth: Payer: Self-pay | Admitting: Otolaryngology

## 2023-05-18 NOTE — Telephone Encounter (Signed)
 Patient would like to know if getting scoped will interfere with her endoscopy appt on wed, please reach out and advise, we are not allowed to call and give clinical advise

## 2023-05-21 ENCOUNTER — Ambulatory Visit (INDEPENDENT_AMBULATORY_CARE_PROVIDER_SITE_OTHER): Payer: 59 | Admitting: Otolaryngology

## 2023-05-21 ENCOUNTER — Encounter (INDEPENDENT_AMBULATORY_CARE_PROVIDER_SITE_OTHER): Payer: Self-pay | Admitting: Otolaryngology

## 2023-05-21 VITALS — BP 157/79 | HR 88

## 2023-05-21 DIAGNOSIS — R490 Dysphonia: Secondary | ICD-10-CM

## 2023-05-21 DIAGNOSIS — J381 Polyp of vocal cord and larynx: Secondary | ICD-10-CM | POA: Diagnosis not present

## 2023-05-21 DIAGNOSIS — R590 Localized enlarged lymph nodes: Secondary | ICD-10-CM

## 2023-05-21 DIAGNOSIS — K219 Gastro-esophageal reflux disease without esophagitis: Secondary | ICD-10-CM | POA: Diagnosis not present

## 2023-05-21 NOTE — Progress Notes (Signed)
 ENT CONSULT:  Reason for Consult: hoarseness x 4 mo  Discussed the use of AI scribe software for clinical note transcription with the patient, who gave verbal consent to proceed.  History of Present Illness   Madison Blake is a 43 year old female who presents with persistent hoarseness since December, 2024.   She has experienced persistent hoarseness since December, following fluctuations in her voice noted in November. During this period, her daughter had walking pneumonia, and she suspects she may have contracted a similar illness. Treatment with steroids and antibiotics alleviated symptoms of an ear infection and congestion, but the hoarseness persisted.  She has had a raspy voice. Currently, she is taking omeprazole, prescribed after a consultation with a gastroenterologist. An abdominal ultrasound revealed gallbladder polyps, and she is scheduled for an upper endoscopy.   She has a history of smoking for about five years during high school and college but quit many years ago. She has been monitoring some swollen lymph nodes with her doctor, which have been stable and under a centimeter in size (left posterior triangle area). She has had one ultrasound to evaluate these lymph nodes.  No pain when swallowing or talking, but she occasionally feels a burning sensation in her throat. She reports postnasal drainage but has not used nasal sprays or allergy pills. No heartburn or bloating.     Records reviewed: [facility] 05/11/23 [name] is a 43 y.o. (DOB 12-Feb-1981) female with no chronic medical history presenting for evaluation of reflux. Patient reports in December she started having symptoms of substernal burning. She has some right chest burning along with hoarseness and abdominal bloating. She was started on pantoprazole which she took recently and that has subsided some of the symptoms. She still having some lingering symptoms. No significant dysphagia. No prior upper endoscopy. No chronic NSAID  use history.  Medications: Outpatient Medications Marked as Taking for the 05/11/23 encounter (Office Visit) with Era Hasty, MD  Medication Sig Dispense Refill  azithromycin (ZITHROMAX) 250 mg tablet Take by mouth.  methylPREDNISolone (MEDROL DOSEPACK) 4 mg tablet see administration instructions.  omeprazole (PRILOSEC) 40 mg capsule Take one capsule (40 mg dose) by mouth daily.  pantoprazole sodium (PROTONIX) 40 mg tablet Take one tablet (40 mg dose) by mouth daily.   1. Gastroesophageal reflux disease, unspecified whether esophagitis present  2. Dyspepsia    Discussion and Plan  Patient presenting with upper GI symptoms which are most likely from underlying GERD. She is on PPI but still having some lingering symptoms. Will plan on diagnostic upper endoscopy for further evaluation. Continue with PPI for now. Advised various lifestyle measures regarding GERD.    History reviewed. No pertinent past medical history.  Past Surgical History:  Procedure Laterality Date   NO PAST SURGERIES      Family History  Problem Relation Age of Onset   Asthma Mother    Cancer Maternal Grandmother    Hypertension Maternal Grandfather     Social History:  reports that she quit smoking about 18 years ago. Her smoking use included cigarettes. She has quit using smokeless tobacco. She reports that she does not drink alcohol and does not use drugs.  Allergies: No Known Allergies  Medications: I have reviewed the patient's current medications.  The PMH, PSH, Medications, Allergies, and SH were reviewed and updated.  ROS: Constitutional: Negative for fever, weight loss and weight gain. Cardiovascular: Negative for chest pain and dyspnea on exertion. Respiratory: Is not experiencing shortness of breath at rest.  Gastrointestinal: Negative for nausea and vomiting. Neurological: Negative for headaches. Psychiatric: The patient is not nervous/anxious  Blood pressure (!) 157/79, pulse 88, last  menstrual period 05/07/2023, SpO2 98%, unknown if currently breastfeeding.  PHYSICAL EXAM:  Exam: General: Well-developed, well-nourished Communication and Voice: raspy Respiratory Respiratory effort: Equal inspiration and expiration without stridor Cardiovascular Peripheral Vascular: Warm extremities with equal color/perfusion Eyes: No nystagmus with equal extraocular motion bilaterally Neuro/Psych/Balance: Patient oriented to person, place, and time; Appropriate mood and affect; Gait is intact with no imbalance; Cranial nerves I-XII are intact Head and Face Inspection: Normocephalic and atraumatic without mass or lesion Palpation: Facial skeleton intact without bony stepoffs Salivary Glands: No mass or tenderness Facial Strength: Facial motility symmetric and full bilaterally ENT Pinna: External ear intact and fully developed External canal: Canal is patent with intact skin Tympanic Membrane: Clear and mobile External Nose: No scar or anatomic deformity Internal Nose: Septum is deviated to the left. No polyp, or purulence. Mucosal edema and erythema present.  Bilateral inferior turbinate hypertrophy.  Lips, Teeth, and gums: Mucosa and teeth intact and viable TMJ: No pain to palpation with full mobility Oral cavity/oropharynx: No erythema or exudate, no lesions present Nasopharynx: No mass or lesion with intact mucosa Hypopharynx: Intact mucosa without pooling of secretions Larynx Glottic: Full true vocal cord mobility without lesion or mass Supraglottic: Normal appearing epiglottis and AE folds Interarytenoid Space: Moderate pachydermia&edema Subglottic Space: Patent without lesion or edema Neck Neck and Trachea: Midline trachea without mass or lesion Thyroid: No mass or nodularity Lymphatics: No lymphadenopathy  Procedure: Preoperative diagnosis: hoarseness x 4 mo  Postoperative diagnosis:   Same + Right mid VF polyp/lesion  Procedure: Flexible fiberoptic  laryngoscopy  Surgeon: Artice Last, MD  Anesthesia: Topical lidocaine  and Afrin Complications: None Condition is stable throughout exam  Indications and consent:  The patient presents to the clinic with Indirect laryngoscopy view was incomplete. Thus it was recommended that they undergo a flexible fiberoptic laryngoscopy. All of the risks, benefits, and potential complications were reviewed with the patient preoperatively and verbal informed consent was obtained.  Procedure: The patient was seated upright in the clinic. Topical lidocaine  and Afrin were applied to the nasal cavity. After adequate anesthesia had occurred, I then proceeded to pass the flexible telescope into the nasal cavity. The nasal cavity was patent without rhinorrhea or polyp. The nasopharynx was also patent without mass or lesion. The base of tongue was visualized and was normal. There were no signs of pooling of secretions in the piriform sinuses. The true vocal folds were mobile bilaterally. There were no signs of glottic or supraglottic mucosal lesion or mass. There was moderate interarytenoid pachydermia and post cricoid edema. The telescope was then slowly withdrawn and the patient tolerated the procedure throughout.  Studies Reviewed:abdominal U/S 05/14/23 IMPRESSION: 1. Distended gallbladder with multiple polyps, with the largest measuring 0.5 cm. No biliary dilatation.   2.  Nonvisualization of the spleen.  Assessment/Plan: Encounter Diagnoses  Name Primary?   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 Lesion observed on the right vocal cord during flexible laryngoscopy. Differential includes benign thickening, polyp, or dysplasia. No signs of malignancy on visual inspection, but biopsy required for definitive  diagnosis. Discussed potential causes including vocal strain/phonotrauma. Explained biopsy necessity  for tissue diagnosis and improve voice quality. Procedure will be done under anesthesia as an outpatient. Informed about two days of voice rest post-procedure and avoiding other surgeries soon after to prevent intubation-related trauma. Lesion removal could improve voice quality by allowing proper vocal cord closure and vibration.   - Schedule outpatient procedure for vocal cord lesion removal (DML, bronch and microflap excision of the right VF lesion  - Advise two days of voice rest post-procedure   - Continue omeprazole for reflux management   - Avoid scheduling other surgeries soon after the vocal cord procedure to prevent intubation-related trauma    Chronic Gastroesophageal Reflux Disease (GERD)   Recent onset of upper chest discomfort and findings on scope exam c/w GERD LPR. Currently on omeprazole. Upper endoscopy scheduled with GI.  - Proceed with upper endoscopy on Wednesday at 10 AM   - Continue omeprazole as prescribed  Cervical lymphadenopathy  Hx of a small node left posterior neck triangle.  She reports she had a neck U/S which showed a sub-cm node in the area of concern. On exam today a sub-cm node palpated along left posterior aspect of the SCM. No increase in size.  No immediate concern unless growth or additional nodes appear. She is otherwise asymptomatic, and denies weight changes, rashes, abnormal labs in the past. Explained that small nodes are common and usually benign unless they grow or have pathologic appearance on imaging.  - Monitor lymph node and repeat neck U/S in 6-12 months   Follow-up   - Surgery schedulers to contact patient for vocal cord lesion removal procedure   - Follow up with results of upper endoscopy and biopsy.     Thank you for allowing me to participate in the care of this patient. Please do not hesitate to contact me with any questions or concerns.    Artice Last, MD Otolaryngology [facility] Phone: [phone] Fax: [phone]    05/21/2023, 6:53 PM

## 2023-06-11 ENCOUNTER — Telehealth: Payer: Self-pay

## 2023-06-11 NOTE — Telephone Encounter (Signed)
 Patient called and LMVM. She has questions in regards to her up coming surgery. Forward message to Tami to call Jury.

## 2023-06-13 ENCOUNTER — Telehealth (INDEPENDENT_AMBULATORY_CARE_PROVIDER_SITE_OTHER): Payer: Self-pay

## 2023-06-13 NOTE — Telephone Encounter (Signed)
 Patient had some questions about her surgery, she wanted to know about how many days she would be advised not to talk, I told her Dr Irene Pap has stated 2 days with complete voice rest, I advised her in the meantime to use honey and lemon to coat her throat, try not to strain her voice, she asked about time in between surgery, she is also looking at gallbladder removal, I told her a good rule is around 6 weeks so that her body can recover from one surgery before having a second.  I advised her to contact her other surgeon to discuss options and also request a anesthesia consult so she would have a better idea of what to expect

## 2023-07-09 ENCOUNTER — Telehealth (INDEPENDENT_AMBULATORY_CARE_PROVIDER_SITE_OTHER): Payer: Self-pay

## 2023-07-10 NOTE — Telephone Encounter (Signed)
 Patient had questions about recovery, and food to avoid, I explained to avoid things with acid, try and not strain her voice to talk at first and drink plenty of fluids to keep vocal cords wet, and Dr Irene Pap would go over more instructions before and after surgery

## 2023-07-19 ENCOUNTER — Encounter (INDEPENDENT_AMBULATORY_CARE_PROVIDER_SITE_OTHER): Payer: 59 | Admitting: Otolaryngology

## 2023-07-23 ENCOUNTER — Other Ambulatory Visit: Payer: Self-pay

## 2023-07-23 ENCOUNTER — Encounter (HOSPITAL_COMMUNITY): Payer: Self-pay | Admitting: *Deleted

## 2023-07-23 NOTE — Progress Notes (Signed)
 PCP - Dr Ardella Beaver Cardiologist - none  Chest x-ray - n/a EKG - n/a Stress Test - n/a ECHO - n/a Cardiac Cath - n/a  ICD Pacemaker/Loop - n/a  Sleep Study -  n/a  Diabetes - n/a  Aspirin & Blood Thinner Instructions:  n/a  NPO   Anesthesia review: no  STOP now taking any Aspirin (unless otherwise instructed by your surgeon), Aleve, Naproxen, Ibuprofen, Motrin, Advil, Goody's, BC's, all herbal medications, fish oil, and all vitamins.   Coronavirus Screening Do you have any of the following symptoms:  Cough yes/no: No Fever (>100.86F)  yes/no: No Runny nose yes/no: No Sore throat yes/no: No Difficulty breathing/shortness of breath  yes/no: No  Have you traveled in the last 14 days and where? [facility] 07/[place]/23-07/22/23.  Patient verbalized understanding of instructions that were given via phone.

## 2023-07-24 NOTE — Anesthesia Preprocedure Evaluation (Signed)
 Anesthesia Evaluation  Patient identified by MRN, date of birth, ID band Patient awake    Reviewed: Allergy & Precautions, Patient's Chart, lab work & pertinent test results  Airway Mallampati: II  TM Distance: >3 FB Neck ROM: Full    Dental no notable dental hx.    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     Pulmonary exam normal        Cardiovascular negative cardio ROS  Rhythm:Regular Rate:Normal     Neuro/Psych  PSYCHIATRIC DISORDERS Anxiety     negative neurological ROS     GI/Hepatic Neg liver ROS,GERD  Controlled and Medicated,,  Endo/Other  Hypothyroidism    Renal/GU negative Renal ROS  negative genitourinary   Musculoskeletal negative musculoskeletal ROS (+)    Abdominal Normal abdominal exam  (+)   Peds  Hematology negative hematology ROS (+)   Anesthesia Other Findings   Reproductive/Obstetrics                             Anesthesia Physical Anesthesia Plan  ASA: 2  Anesthesia Plan: General   Post-op Pain Management: Tylenol PO (pre-op)*   Induction: Intravenous  PONV Risk Score and Plan: 4 or greater and Ondansetron, Dexamethasone, Midazolam, Treatment may vary due to age or medical condition and Scopolamine patch - Pre-op  Airway Management Planned: Oral ETT and Mask  Additional Equipment: None  Intra-op Plan:   Post-operative Plan: Extubation in OR  Informed Consent: I have reviewed the patients History and Physical, chart, labs and discussed the procedure including the risks, benefits and alternatives for the proposed anesthesia with the patient or authorized representative who has indicated his/her understanding and acceptance.     Dental advisory given  Plan Discussed with: CRNA  Anesthesia Plan Comments: (Laser ETT)       Anesthesia Quick Evaluation

## 2023-07-25 ENCOUNTER — Ambulatory Visit (HOSPITAL_COMMUNITY)
Admission: RE | Admit: 2023-07-25 | Discharge: 2023-07-25 | Disposition: A | Discharge status: Home / Self Care | Payer: 59 | Attending: Otolaryngology | Admitting: Otolaryngology

## 2023-07-25 ENCOUNTER — Other Ambulatory Visit: Payer: Self-pay

## 2023-07-25 ENCOUNTER — Ambulatory Visit (HOSPITAL_COMMUNITY): Admitting: Anesthesiology

## 2023-07-25 ENCOUNTER — Ambulatory Visit (HOSPITAL_BASED_OUTPATIENT_CLINIC_OR_DEPARTMENT_OTHER): Admitting: Anesthesiology

## 2023-07-25 ENCOUNTER — Encounter (HOSPITAL_COMMUNITY): Payer: Self-pay | Admitting: *Deleted

## 2023-07-25 ENCOUNTER — Encounter (HOSPITAL_COMMUNITY)
Admission: RE | Disposition: A | Discharge status: Home / Self Care | Payer: Self-pay | Source: Home / Self Care | Attending: Otolaryngology

## 2023-07-25 DIAGNOSIS — K219 Gastro-esophageal reflux disease without esophagitis: Secondary | ICD-10-CM | POA: Insufficient documentation

## 2023-07-25 DIAGNOSIS — R490 Dysphonia: Secondary | ICD-10-CM | POA: Insufficient documentation

## 2023-07-25 DIAGNOSIS — E039 Hypothyroidism, unspecified: Secondary | ICD-10-CM | POA: Insufficient documentation

## 2023-07-25 DIAGNOSIS — J383 Other diseases of vocal cords: Secondary | ICD-10-CM

## 2023-07-25 DIAGNOSIS — J45909 Unspecified asthma, uncomplicated: Secondary | ICD-10-CM | POA: Insufficient documentation

## 2023-07-25 DIAGNOSIS — J381 Polyp of vocal cord and larynx: Secondary | ICD-10-CM | POA: Insufficient documentation

## 2023-07-25 DIAGNOSIS — Z87891 Personal history of nicotine dependence: Secondary | ICD-10-CM | POA: Diagnosis not present

## 2023-07-25 DIAGNOSIS — Z01818 Encounter for other preprocedural examination: Secondary | ICD-10-CM

## 2023-07-25 HISTORY — PX: MICROLARYNGOSCOPY WITH CO2 LASER AND EXCISION OF VOCAL CORD LESION: SHX5970

## 2023-07-25 HISTORY — PX: RIGID BRONCHOSCOPY: SHX5069

## 2023-07-25 HISTORY — DX: Hyperlipidemia, unspecified: E78.5

## 2023-07-25 HISTORY — DX: Anemia, unspecified: D64.9

## 2023-07-25 HISTORY — DX: Hypothyroidism, unspecified: E03.9

## 2023-07-25 HISTORY — DX: Unspecified asthma, uncomplicated: J45.909

## 2023-07-25 HISTORY — DX: Gastro-esophageal reflux disease without esophagitis: K21.9

## 2023-07-25 HISTORY — DX: Cardiac arrhythmia, unspecified: I49.9

## 2023-07-25 LAB — CBC
HCT: 35.5 % — ABNORMAL LOW (ref 36.0–46.0)
Hemoglobin: 11.3 g/dL — ABNORMAL LOW (ref 12.0–15.0)
MCH: 28 pg (ref 26.0–34.0)
MCHC: 31.8 g/dL (ref 30.0–36.0)
MCV: 88.1 fL (ref 80.0–100.0)
Platelets: 337 10*3/uL (ref 150–400)
RBC: 4.03 MIL/uL (ref 3.87–5.11)
RDW: 12.4 % (ref 11.5–15.5)
WBC: 4.1 10*3/uL (ref 4.0–10.5)
nRBC: 0 % (ref 0.0–0.2)

## 2023-07-25 LAB — POCT PREGNANCY, URINE: Preg Test, Ur: NEGATIVE

## 2023-07-25 SURGERY — MICROLARYNGOSCOPY WITH CO2 LASER AND EXCISION OF VOCAL CORD LESION
Anesthesia: General | Laterality: Right

## 2023-07-25 MED ORDER — ROCURONIUM BROMIDE 10 MG/ML (PF) SYRINGE
PREFILLED_SYRINGE | INTRAVENOUS | Status: DC | PRN
Start: 2023-07-25 — End: 2023-07-25
  Administered 2023-07-25: 50 mg via INTRAVENOUS
  Administered 2023-07-25: 10 mg via INTRAVENOUS

## 2023-07-25 MED ORDER — PHENYLEPHRINE HCL-NACL 20-0.9 MG/250ML-% IV SOLN
INTRAVENOUS | Status: DC | PRN
  Administered 2023-07-25: 10 ug/min via INTRAVENOUS

## 2023-07-25 MED ORDER — PROPOFOL 10 MG/ML IV BOLUS
INTRAVENOUS | Status: AC
  Filled 2023-07-25: qty 20

## 2023-07-25 MED ORDER — OXYCODONE HCL 5 MG PO TABS: 5.0000 mg | ORAL_TABLET | Freq: Once | ORAL | Status: DC | PRN

## 2023-07-25 MED ORDER — SODIUM CHLORIDE 0.9 % IV SOLN
0.1500 ug/kg/min | INTRAVENOUS | Status: DC
  Administered 2023-07-25: .15 ug/kg/min via INTRAVENOUS
  Filled 2023-07-25: qty 2000

## 2023-07-25 MED ORDER — DEXAMETHASONE SODIUM PHOSPHATE 10 MG/ML IJ SOLN
INTRAMUSCULAR | Status: DC | PRN
  Administered 2023-07-25: 10 mg via INTRAVENOUS

## 2023-07-25 MED ORDER — MIDAZOLAM HCL 5 MG/5ML IJ SOLN
INTRAMUSCULAR | Status: DC | PRN
  Administered 2023-07-25: 2 mg via INTRAVENOUS

## 2023-07-25 MED ORDER — MIDAZOLAM HCL 2 MG/2ML IJ SOLN
INTRAMUSCULAR | Status: AC
  Filled 2023-07-25: qty 2

## 2023-07-25 MED ORDER — ONDANSETRON HCL 4 MG/2ML IJ SOLN: 4.0000 mg | Freq: Once | INTRAMUSCULAR | Status: DC | PRN

## 2023-07-25 MED ORDER — ROCURONIUM BROMIDE 10 MG/ML (PF) SYRINGE
PREFILLED_SYRINGE | INTRAVENOUS | Status: AC
  Filled 2023-07-25: qty 10

## 2023-07-25 MED ORDER — SCOPOLAMINE 1 MG/3DAYS TD PT72
1.0000 | MEDICATED_PATCH | TRANSDERMAL | Status: DC
  Administered 2023-07-25: 1.5 mg via TRANSDERMAL
  Filled 2023-07-25: qty 1

## 2023-07-25 MED ORDER — FENTANYL CITRATE (PF) 250 MCG/5ML IJ SOLN
INTRAMUSCULAR | Status: DC | PRN
  Administered 2023-07-25: 100 ug via INTRAVENOUS
  Administered 2023-07-25: 50 ug via INTRAVENOUS

## 2023-07-25 MED ORDER — ONDANSETRON HCL 4 MG/2ML IJ SOLN
INTRAMUSCULAR | Status: AC
Start: 2023-07-25 — End: ?
  Filled 2023-07-25: qty 2

## 2023-07-25 MED ORDER — OXYMETAZOLINE HCL 0.05 % NA SOLN
NASAL | Status: AC
Start: 2023-07-25 — End: ?
  Filled 2023-07-25: qty 30

## 2023-07-25 MED ORDER — ACETAMINOPHEN 500 MG PO TABS
1000.0000 mg | ORAL_TABLET | Freq: Once | ORAL | Status: AC
  Administered 2023-07-25: 1000 mg via ORAL
  Filled 2023-07-25: qty 2

## 2023-07-25 MED ORDER — ONDANSETRON HCL 4 MG/2ML IJ SOLN
INTRAMUSCULAR | Status: DC | PRN
  Administered 2023-07-25: 4 mg via INTRAVENOUS

## 2023-07-25 MED ORDER — AMISULPRIDE (ANTIEMETIC) 5 MG/2ML IV SOLN: 10.0000 mg | Freq: Once | INTRAVENOUS | Status: DC | PRN

## 2023-07-25 MED ORDER — LIDOCAINE-EPINEPHRINE 1 %-1:100000 IJ SOLN
INTRAMUSCULAR | Status: DC | PRN
  Administered 2023-07-25: 1 mL

## 2023-07-25 MED ORDER — CHLORHEXIDINE GLUCONATE 0.12 % MT SOLN
OROMUCOSAL | Status: AC
  Administered 2023-07-25: 15 mL
  Filled 2023-07-25: qty 15

## 2023-07-25 MED ORDER — OXYCODONE HCL 5 MG/5ML PO SOLN: 5.0000 mg | Freq: Once | ORAL | Status: DC | PRN

## 2023-07-25 MED ORDER — FENTANYL CITRATE (PF) 250 MCG/5ML IJ SOLN
INTRAMUSCULAR | Status: AC
  Filled 2023-07-25: qty 5

## 2023-07-25 MED ORDER — OXYMETAZOLINE HCL 0.05 % NA SOLN
NASAL | Status: DC | PRN
  Administered 2023-07-25: 1 via TOPICAL

## 2023-07-25 MED ORDER — HYDROMORPHONE HCL 1 MG/ML IJ SOLN: 0.2500 mg | INTRAMUSCULAR | Status: DC | PRN

## 2023-07-25 MED ORDER — SUGAMMADEX SODIUM 200 MG/2ML IV SOLN
INTRAVENOUS | Status: DC | PRN
  Administered 2023-07-25: 240 mg via INTRAVENOUS

## 2023-07-25 MED ORDER — LACTATED RINGERS IV SOLN: INTRAVENOUS | Status: DC | PRN

## 2023-07-25 MED ORDER — PROPOFOL 500 MG/50ML IV EMUL
INTRAVENOUS | Status: DC | PRN
  Administered 2023-07-25: 100 ug/kg/min via INTRAVENOUS

## 2023-07-25 MED ORDER — 0.9 % SODIUM CHLORIDE (POUR BTL) OPTIME
TOPICAL | Status: DC | PRN
  Administered 2023-07-25: 1000 mL

## 2023-07-25 MED ORDER — LIDOCAINE 2% (20 MG/ML) 5 ML SYRINGE
INTRAMUSCULAR | Status: AC
  Filled 2023-07-25: qty 5

## 2023-07-25 MED ORDER — DEXAMETHASONE SODIUM PHOSPHATE 10 MG/ML IJ SOLN
INTRAMUSCULAR | Status: AC
Start: 2023-07-25 — End: ?
  Filled 2023-07-25: qty 1

## 2023-07-25 MED ORDER — PROPOFOL 10 MG/ML IV BOLUS
INTRAVENOUS | Status: DC | PRN
  Administered 2023-07-25: 30 mg via INTRAVENOUS
  Administered 2023-07-25: 100 mg via INTRAVENOUS

## 2023-07-25 SURGICAL SUPPLY — 29 items
BAG COUNTER SPONGE SURGICOUNT (BAG) ×2 IMPLANT
CANISTER SUCT 3000ML PPV (MISCELLANEOUS) ×2 IMPLANT
CNTNR URN SCR LID CUP LEK RST (MISCELLANEOUS) IMPLANT
COVER BACK TABLE 60X90IN (DRAPES) ×2 IMPLANT
COVER MAYO STAND STRL (DRAPES) ×2 IMPLANT
DRAPE HALF SHEET 40X57 (DRAPES) ×2 IMPLANT
DRSG TELFA 3X8 NADH STRL (GAUZE/BANDAGES/DRESSINGS) ×2 IMPLANT
GAUZE SPONGE 4X4 12PLY STRL (GAUZE/BANDAGES/DRESSINGS) ×2 IMPLANT
GLOVE BIO SURGEON STRL SZ 6 (GLOVE) ×2 IMPLANT
GOWN STRL REUS W/TWL LRG LVL3 (GOWN DISPOSABLE) ×4 IMPLANT
GUARD TEETH (MISCELLANEOUS) IMPLANT
KIT BASIN OR (CUSTOM PROCEDURE TRAY) ×2 IMPLANT
KIT TURNOVER KIT B (KITS) ×2 IMPLANT
KNIFE SATALOFF SICKLE ENT 3X5 (ORTHOPEDIC DISPOSABLE SUPPLIES) IMPLANT
MARKER SKIN DUAL TIP RULER LAB (MISCELLANEOUS) IMPLANT
NDL HYPO 25GX1X1/2 BEV (NEEDLE) IMPLANT
NEEDLE HYPO 25GX1X1/2 BEV (NEEDLE) IMPLANT
NS IRRIG 1000ML POUR BTL (IV SOLUTION) ×2 IMPLANT
PAD ARMBOARD POSITIONER FOAM (MISCELLANEOUS) ×4 IMPLANT
PATTIES SURGICAL .5 X.5 (GAUZE/BANDAGES/DRESSINGS) ×2 IMPLANT
POSITIONER HEAD DONUT 9IN (MISCELLANEOUS) ×2 IMPLANT
SET COLLECT BLD 25X3/4 12 (NEEDLE) IMPLANT
SOL ANTI FOG 6CC (MISCELLANEOUS) ×2 IMPLANT
SURGILUBE 2OZ TUBE FLIPTOP (MISCELLANEOUS) ×2 IMPLANT
SYR 3ML LL SCALE MARK (SYRINGE) IMPLANT
SYR TB 1ML LUER SLIP (SYRINGE) IMPLANT
TOWEL GREEN STERILE FF (TOWEL DISPOSABLE) ×4 IMPLANT
TUBE CONNECTING 12X1/4 (SUCTIONS) ×4 IMPLANT
WATER STERILE IRR 1000ML POUR (IV SOLUTION) ×2 IMPLANT

## 2023-07-25 NOTE — H&P (Signed)
 Madison Blake is an 43 y.o. female.    Chief Complaint:  dysphonia, R mid VF polyp/lesion  HPI: Patient presents today for planned elective procedure.  He/she denies any interval change in history since office visit on 05/21/23.   Past Medical History:  Diagnosis Date   Anemia    Asthma    exercised induced   Dysrhythmia    GERD (gastroesophageal reflux disease)    HLD (hyperlipidemia)    diet controlled - no meds   Hypothyroidism    hx in the past, no problems in the last 9 yrs per patient on 07/23/23, no meds   postpartium anxiety 2020   hx only, no current problem    Past Surgical History:  Procedure Laterality Date   NO PAST SURGERIES      Family History  Problem Relation Age of Onset   Asthma Mother    Cancer Maternal Grandmother    Hypertension Maternal Grandfather     Social History:  reports that she quit smoking about 18 years ago. Her smoking use included cigarettes. She has never used smokeless tobacco. She reports current alcohol use of about 5.0 standard drinks of alcohol per week. She reports that she does not use drugs.  Allergies: No Known Allergies  Medications Prior to Admission  Medication Sig Dispense Refill   Ferrous Sulfate (IRON PO) Take 1 tablet by mouth daily.     ibuprofen (ADVIL,MOTRIN) 600 MG tablet Take 1 tablet (600 mg total) by mouth every 6 (six) hours. (Patient taking differently: Take 600 mg by mouth every 6 (six) hours as needed for moderate pain (pain score 4-6).) 30 tablet 0   Melatonin 10 MG CAPS Take 10 mg by mouth at bedtime.     Multiple Vitamin (MULTIVITAMIN) tablet Take 1 tablet by mouth daily.     Multiple Vitamins-Minerals (ZINC PO) Take 1 tablet by mouth daily as needed (Supplement).     omeprazole (PRILOSEC) 40 MG capsule Take 40 mg by mouth daily.     Probiotic Product (PROBIOTIC PO) Take 1 Dose by mouth daily as needed ([employer]).     VITAMIN D PO Take 1 tablet by mouth daily as needed (Supplement).     albuterol (VENTOLIN  HFA) 108 (90 Base) MCG/ACT inhaler Inhale 1-2 puffs into the lungs every 6 (six) hours as needed for wheezing or shortness of breath.      Results for orders placed or performed during the hospital encounter of 07/25/23 (from the past 48 hours)  CBC     Status: Abnormal   Collection Time: 07/25/23  9:20 AM  Result Value Ref Range   WBC 4.1 4.0 - 10.5 K/uL   RBC 4.03 3.87 - 5.11 MIL/uL   Hemoglobin 11.3 (L) 12.0 - 15.0 g/dL   HCT 16.1 (L) 09.6 - 04.5 %   MCV 88.1 80.0 - 100.0 fL   MCH 28.0 26.0 - 34.0 pg   MCHC 31.8 30.0 - 36.0 g/dL   RDW 40.9 81.1 - 91.4 %   Platelets 337 150 - 400 K/uL   nRBC 0.0 0.0 - 0.2 %    Comment: Performed at [facility], [address] [place], [place] [address]  Pregnancy, urine POC     Status: None   Collection Time: 07/25/23  9:21 AM  Result Value Ref Range   Preg Test, Ur NEGATIVE NEGATIVE    Comment:        THE SENSITIVITY OF THIS METHODOLOGY IS >24 mIU/mL  No results found.  ROS: Review of Systems  Constitutional:  Negative for chills and fever.  HENT:  Negative for congestion and sinus pain.   Respiratory:  Negative for cough.   Cardiovascular:  Negative for chest pain and palpitations.  Gastrointestinal:  Negative for nausea and vomiting.  Genitourinary:  Negative for dysuria.  Musculoskeletal:  Negative for neck pain.  Skin:  Negative for rash.  Neurological:  Negative for headaches.  Psychiatric/Behavioral:  The patient does not have insomnia.     Blood pressure (!) 126/92, pulse (!) 52, temperature (!) 97.4 F (36.3 C), temperature source Oral, resp. rate 18, height 5\' 7"  (1.702 m), weight 62.6 kg, last menstrual period 07/19/2023, SpO2 100%, unknown if currently breastfeeding.  PHYSICAL EXAM: Physical Exam Constitutional:      General: She is not in acute distress. HENT:     Head: Normocephalic and atraumatic.     Nose: Nose normal.     Mouth/Throat:     Mouth: Mucous membranes are moist.  Eyes:     Extraocular  Movements: Extraocular movements intact.     Pupils: Pupils are equal, round, and reactive to light.  Cardiovascular:     Rate and Rhythm: Normal rate.  Pulmonary:     Effort: Pulmonary effort is normal.  Abdominal:     Palpations: Abdomen is soft.  Musculoskeletal:        General: Normal range of motion.     Cervical back: Neck supple.  Skin:    General: Skin is warm and dry.  Neurological:     Mental Status: She is oriented to person, place, and time.  Psychiatric:        Mood and Affect: Mood normal.     Assessment/Plan Dysphonia R VF polyp/lesion  Risks and benefits discussed and patient would like to proceed with DML and right VF lesion excision microflap technique.     Allie Gerhold 07/25/2023, 10:11 AM

## 2023-07-25 NOTE — Discharge Instructions (Addendum)
 POST-OP Instructions  Vocal Cord Surgery This post-operative instruction sheet is designed to help you care for your voice/throat after surgery and help answer any of the common questions you may have. It is not entirely comprehensive, so if you have any questions, do not hesitate to call the office.   What to Expect: - It is common to have a sore throat for several days after surgery. You may have some tongue numbness/pain or taste changes as well - these are temporary but can take several weeks to resolve. - You can eat and drink anything you normally do - there are no restrictions due to surgery alone.  If you have been recommended any other type of diet, such as an acid reflux diet, you should continue that.  You may want to eat light meals the day of anesthesia to make sure you don't get nauseated. You may have some pain after surgery, even with pain medications. To make this pain more tolerable, you should use Tylenol and/or ibuprofen. You can take 500mg -600mg  of tylenol every 6 hours, alternating with to 400-600mg  of ibuprofen every 6 hours, so long as your regular doctor hasn't told you to avoid either of these medications. If you don't take acid reflux medication already, you should add a dose of famotidine 20mg  with any ibuprofen dose over 400mg . If you're not sure whether these medications are safe for you, you should ask Dr Irene Pap or your regular doctor first (particularly if you have a heart condition or history of stomach ulcers or are on blood thinners). If it is more severe pain that doesn't respond to the medications above, you should request or use the prescription narcotic pain medicine you were given. (You can call the office to request a script if you need one.)  If you have to use narcotics, plan to use a stool softener as well.  - It is very important that you stay well-hydrated and drink plenty of fluids during the recovery period.  Getting dehydrated tends to worsen  post-operative pain. - Avoid tobacco products, spicy foods, excessive alcohol, or eating late at night as these may cause heartburn or reflux of stomach acid into the throat and may delay the healing process.  If you are on reflux medication already (such as Prilosec (omeprazole) or Nexium (esomeprazole)), continue it after surgery.  If you are not on anything regularly and have reflux symptoms in the post-operative period (heartburn, throat burning, excessive throat mucous or throat-clearing, sensation of a lump in your throat) then you can treat these symptoms with over-the-counter Pepcid 20-40mg . - Avoid coughing or throat clearing. If you have the urge to clear your throat, take a sip of water and a hard swallow instead. Drinking plenty of water can help alleviate the urge to clear the throat.  If you cannot control your cough, you may take the narcotic pain medicine as a cough suppressant - it works similar to codeine cough syrup to help decrease cough.  If this doesn't help or the cough is excessive, please call the office. - You can return to normal activity 24-48 hours after surgery; avoid intense exercise for the first week. - Call the office if you experience any of the following: Fever higher than 101 F Complete loss of voice Difficulty breathing or swallowing Bleeding from the mouth  Voice Use After Surgery: - Follow the voice rest instructions. Rest your voice for 48 hrs and this means that you need to use dry erase board or texting to communicate.  After 48 hrs, start to slowly use your voice and do some glides with your voice, gentle humming or singing to warm up.   Nutrition: - Some patients have less of an appetite after surgery, and it is ok to decrease food intake. However, it is not ok to decrease fluid intake. It is very important to continue to drink plenty of fluids. - Drinking fluids will help lessen throat discomfort. -You can eat and drink as you normally do, but limit any  foods that might cause acid reflux (fried foods, meat, caffeine or alcohol, acidic sauces or fruits).  You may want to eat light meals the day of anesthesia to make sure you don't get nauseated.  Safety Information for Giving and Taking Medications: - Each time you give a medication, read the label. - If your medication is in liquid form, do not measure liquid with a kitchen spoon. There are pediatric measuring devices available at the pharmacy. Ask for one when you get your prescription filled. - If you have questions, ask the pharmacist. - Do not take Tylenol for pain if you have a history of liver problems. Check with your primary care doctor if you are uncertain. - Do not take ibuprofen for pain if you have a history of stomach bleeding or ulcers and have been told to avoid Non-Steroidal Anti-Inflammatory Drugs (NSAIDs).  Certain blood thinners can also interact with ibuprofen.  Check with your primary care doctor if you are uncertain.  Follow-up Appointment: You should see Dr Kyrie Bun approximately 2-3 weeks after your surgery. If you don't have an appointment, please call her office to schedule it.   You can resume all of your medications after this procedure   - Take Reflux Gourmet (natural supplement available on Amazon) to help with symptoms of chronic throat irritation from reflux

## 2023-07-25 NOTE — Op Note (Signed)
 OPERATIVE REPORT  PREOPERATIVE DIAGNOSIS: 1. Right vocal fold submucosal cyst/lesion 2. Dysphonia and glottic insufficiency   POSTOPERATIVE DIAGNOSIS: 1. Right vocal fold submucosal cyst/lesion 2. Dysphonia and glottic insufficiency  PROCEDURE: 1. Direct microlaryngoscopy rigid bronchoscopy and submucosal excision of Right vocal fold cyst using microflap technique  SURGEON: Artice Last, MD  ANESTHESIA: General endotracheal   COMPLICATIONS: None.  ESTIMATED BLOOD LOSS: < 5 mL  INTRAOPERATIVE FINDINGS: Right true vocal fold submucosal cyst  SPECIMEN: Right true vocal fold cyst  INDICATIONS AND CONSENT: 60 yoF with history of chronic dysphonia and evidence of right vocal fold lesion on scope exam in the office. The procedure, risks, benefits, alternatives, potential complications, possible outcomes as well as the option of no treatment were reviewed with the patient who indicated their understanding and wished to proceed forward with the procedure. Informed consent was obtained.  DESCRIPTION OF PROCEDURE: On 07/25/23 the patient was brought to the operating room by the anesthesia team. The patient was transferred onto the operating table and placed in supine position. After smooth induction of general endotracheal anesthesia, a surgeon initiated time-out was performed. Next, the head of the bed had been rotated 90 degrees. The patient was then draped in the appropriate fashion.  We first began by inserting a maxillary tooth guard over the maxillary dentition for protection during the procedure. The female Sataloff laryngoscope was then advanced into the oral cavity and oropharynx until the larynx was brought into view. Once the larynx was adequately visualized the patient was placed into suspension. A 0-degree Hopkins endoscope was then used to visualize the larynx and photo documentation was obtained. Thorough examination revealed a right mid true vocal fold submucosal lesion. Rigid  bronchoscopy revealed no additional lesions in the subglottis or proximal trachea.  Photodocumentation was obtained.   We injected a small amount of 1% lidocaine with epinephrine 1:100,000 at the periphery of the lesion.  Next a second knife was used to create a submucosal incision just lateral to the lesion which appeared similar to a submucosal cyst.  Using a combination of microlaryngeal instruments and suction a plane was created along the borders of the cyst and it was removed preserving the vocal fold mucosa, which was laid back down. Afrin soaked pledgets were used to clear off  blood tinged secretions.  Photo documentation was once again obtained. The laryngoscope was then taken out of suspension and removed under direct visualization without injury to the lips or gingiva. The maxillary tooth guard was removed and the dentition was noted to be without injury.  This completed the procedure. The patient tolerated the procedure well without any immediate post operative complications. All counts were correct x2 at the conclusion of the procedure.

## 2023-07-25 NOTE — Anesthesia Procedure Notes (Addendum)
 Procedure Name: Intubation Date/Time: 07/25/2023 10:45 AM  Performed by: Claud Crumb, CRNAPre-anesthesia Checklist: Patient identified, Emergency Drugs available, Suction available and Patient being monitored Patient Re-evaluated:Patient Re-evaluated prior to induction Oxygen Delivery Method: Circle System Utilized Preoxygenation: Pre-oxygenation with 100% oxygen Induction Type: IV induction Ventilation: Mask ventilation without difficulty Laryngoscope Size: Glidescope and 3 Grade View: Grade II Tube type: MLT Tube size: 5.0 mm Number of attempts: 1 Airway Equipment and Method: Stylet and Oral airway Placement Confirmation: ETT inserted through vocal cords under direct vision, positive ETCO2 and breath sounds checked- equal and bilateral Secured at: 21 cm Tube secured with: Tape Dental Injury: Teeth and Oropharynx as per pre-operative assessment  Comments: Unable to pass OETT given rigid stylet inability to pass plastic handle via 22mm adapter.  Successful attempt with standard stylet to provide rigidity to OETT distal end.  Small upper right lip laceration noted following intubation.  5.0 size psr.

## 2023-07-25 NOTE — Transfer of Care (Signed)
 Immediate Anesthesia Transfer of Care Note  Patient: Madison Blake  Procedure(s) Performed: MICROLARYNGOSCOPY WITH CO2 LASER AND EXCISION OF VOCAL CORD LESION (Right) BRONCHOSCOPY, RIGID  Patient Location: PACU  Anesthesia Type:General  Level of Consciousness: awake, drowsy, patient cooperative, and responds to stimulation  Airway & Oxygen Therapy: Patient Spontanous Breathing and Patient connected to face mask oxygen  Post-op Assessment: Report given to RN and Post -op Vital signs reviewed and stable  Post vital signs: Reviewed and stable  Last Vitals:  Vitals Value Taken Time  BP 110/74 07/25/23 1115  Temp 36.3 C 07/25/23 1115  Pulse 51 07/25/23 1119  Resp 11 07/25/23 1119  SpO2 100 % 07/25/23 1119  Vitals shown include unfiled device data.  Last Pain:  Vitals:   07/25/23 1115  TempSrc:   PainSc: 0-No pain      Patients Stated Pain Goal: 0 (07/25/23 0857)  Complications: No notable events documented.

## 2023-07-26 ENCOUNTER — Encounter (HOSPITAL_COMMUNITY): Payer: Self-pay | Admitting: Otolaryngology

## 2023-07-26 LAB — SURGICAL PATHOLOGY

## 2023-07-26 NOTE — Anesthesia Postprocedure Evaluation (Signed)
 Anesthesia Post Note  Patient: Joscelyn Salvo  Procedure(s) Performed: MICROLARYNGOSCOPY WITH CO2 LASER AND EXCISION OF VOCAL CORD LESION (Right) BRONCHOSCOPY, RIGID     Patient location during evaluation: PACU Anesthesia Type: General Level of consciousness: awake and alert Pain management: pain level controlled Vital Signs Assessment: post-procedure vital signs reviewed and stable Respiratory status: spontaneous breathing, nonlabored ventilation, respiratory function stable and patient connected to nasal cannula oxygen Cardiovascular status: blood pressure returned to baseline and stable Postop Assessment: no apparent nausea or vomiting Anesthetic complications: no   No notable events documented.  Last Vitals:  Vitals:   07/25/23 1130 07/25/23 1145  BP: (!) 146/80 128/82  Pulse: 60 (!) 44  Resp: 11 14  Temp:  (!) 36.3 C  SpO2: 100% 100%    Last Pain:  Vitals:   07/25/23 1145  TempSrc:   PainSc: 0-No pain                 Theotis Flake P Eileen Kangas

## 2023-08-10 ENCOUNTER — Telehealth: Payer: Self-pay

## 2023-08-10 ENCOUNTER — Telehealth (INDEPENDENT_AMBULATORY_CARE_PROVIDER_SITE_OTHER): Payer: Self-pay

## 2023-08-10 NOTE — Telephone Encounter (Signed)
 Patient left a voicemail stating she was 3 weeks post vocal cord surgery. She has additional questions about using her voice this weekend. She would like a call back. I will route it to the ENT CMAs.

## 2023-08-10 NOTE — Telephone Encounter (Signed)
 Patient called with questions about being in a social setting, I explained we didn't want to tell her not to, but she should conserve her voice, don't be yelling or straining her voice if at all possible, make sure she in drinking fluids to keep her vocal cords wet.

## 2023-08-13 ENCOUNTER — Encounter (INDEPENDENT_AMBULATORY_CARE_PROVIDER_SITE_OTHER): Payer: Self-pay | Admitting: Otolaryngology

## 2023-08-13 ENCOUNTER — Ambulatory Visit (INDEPENDENT_AMBULATORY_CARE_PROVIDER_SITE_OTHER): Admitting: Otolaryngology

## 2023-08-13 VITALS — BP 134/95 | HR 62

## 2023-08-13 DIAGNOSIS — J381 Polyp of vocal cord and larynx: Secondary | ICD-10-CM

## 2023-08-13 DIAGNOSIS — R591 Generalized enlarged lymph nodes: Secondary | ICD-10-CM | POA: Diagnosis not present

## 2023-08-13 DIAGNOSIS — K219 Gastro-esophageal reflux disease without esophagitis: Secondary | ICD-10-CM | POA: Diagnosis not present

## 2023-08-13 DIAGNOSIS — R490 Dysphonia: Secondary | ICD-10-CM

## 2023-08-13 DIAGNOSIS — R0982 Postnasal drip: Secondary | ICD-10-CM | POA: Diagnosis not present

## 2023-08-13 DIAGNOSIS — R590 Localized enlarged lymph nodes: Secondary | ICD-10-CM

## 2023-08-13 DIAGNOSIS — R0981 Nasal congestion: Secondary | ICD-10-CM

## 2023-08-13 MED ORDER — LEVOCETIRIZINE DIHYDROCHLORIDE 5 MG PO TABS
5.0000 mg | ORAL_TABLET | Freq: Every evening | ORAL | 3 refills | Status: AC
Start: 2023-08-13 — End: ?

## 2023-08-13 MED ORDER — FLUTICASONE PROPIONATE 50 MCG/ACT NA SUSP: 2.0000 | Freq: Every day | NASAL | 6 refills | Status: AC

## 2023-08-13 NOTE — Patient Instructions (Signed)

## 2023-08-13 NOTE — Progress Notes (Signed)
 ENT Progress Note:   Update 08/13/2023  Discussed the use of AI scribe software for clinical note transcription with the patient, who gave verbal consent to proceed.  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  Her voice has improved with less hoarseness and discomfort during prolonged talking. However, she still experiences difficulty with sustained long conversations and her voice resonance does not feel strong yet.  She manages her reflux by reducing coffee intake and other strategies. Despite these efforts, she occasionally experiences irritation from reflux. She drinks a low-acid half-caf coffee, about a cup and a half daily, and avoids scratchy foods and alcohol to prevent exacerbating her symptoms.  She experiences postnasal drainage and nasal congestion but has not previously used allergy medications. She has never tried [employer].  She follows advice from a friend who is a Doctor, general practice to rest her voice proportionally to the time spent talking. 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  Records Reviewed:  Initial Evaluation  Reason for Consult: hoarseness x 4 mo  Discussed the use of AI scribe software for clinical note transcription with the patient, who gave verbal consent to proceed.  History of Present Illness   Madison Blake is a 43 year old female who presents with persistent hoarseness since December, 2024.   She has experienced persistent hoarseness since December, following fluctuations in her voice noted in November. During this period, her daughter had walking pneumonia, and she suspects she may have contracted a similar illness. Treatment with steroids and antibiotics alleviated symptoms of an ear infection and congestion, but the hoarseness persisted.  She has had a raspy voice. Currently, she is  taking omeprazole, prescribed after a consultation with a gastroenterologist. An abdominal ultrasound revealed gallbladder polyps, and she is scheduled for an upper endoscopy.   She has a history of smoking for about five years during high school and college but quit many years ago. She has been monitoring some swollen lymph nodes with her doctor, which have been stable and under a centimeter in size (left posterior triangle area). She has had one ultrasound to evaluate these lymph nodes.  No pain when swallowing or talking, but she occasionally feels a burning sensation in her throat. She reports postnasal drainage but has not used nasal sprays or allergy pills. No heartburn or bloating.     Records reviewed: [facility] 05/11/23 Madison Blake is a 42 y.o. (DOB 01-09-1981) female with no chronic medical history presenting for evaluation of reflux. Patient reports in December she started having symptoms of substernal burning. She has some right chest burning along with hoarseness and abdominal bloating. She was started on pantoprazole which she took recently and that has subsided some of the symptoms. She still having some lingering symptoms. No significant dysphagia. No prior upper endoscopy. No chronic NSAID use history.  Medications: Outpatient Medications Marked as Taking for the 05/11/23 encounter (Office Visit) with Era Hasty, MD  Medication Sig Dispense Refill  azithromycin (ZITHROMAX) 250 mg tablet Take by mouth.  methylPREDNISolone (MEDROL DOSEPACK) 4 mg tablet see administration instructions.  omeprazole (PRILOSEC) 40 mg capsule Take one capsule (40 mg dose) by mouth daily.  pantoprazole sodium (PROTONIX) 40 mg tablet Take one tablet (40 mg dose) by mouth daily.   1. Gastroesophageal reflux disease, unspecified whether esophagitis present  2. Dyspepsia  Discussion and Plan  Patient presenting with upper GI symptoms which are most likely from underlying GERD. She is on PPI but still  having some lingering symptoms. Will plan on diagnostic upper endoscopy for further evaluation. Continue with PPI for now. Advised various lifestyle measures regarding GERD.    Past Medical History:  Diagnosis Date   Anemia    Asthma    exercised induced   Dysrhythmia    GERD (gastroesophageal reflux disease)    HLD (hyperlipidemia)    diet controlled - no meds   Hypothyroidism    hx in the past, no problems in the last 9 yrs per patient on 07/23/23, no meds   postpartium anxiety 2020   hx only, no current problem    Past Surgical History:  Procedure Laterality Date   MICROLARYNGOSCOPY WITH CO2 LASER AND EXCISION OF VOCAL CORD LESION Right 07/25/2023   Procedure: MICROLARYNGOSCOPY WITH CO2 LASER AND EXCISION OF VOCAL CORD LESION;  Surgeon: Artice Last, MD;  Location: [facility] OR;  Service: ENT;  Laterality: Right;   NO PAST SURGERIES     RIGID BRONCHOSCOPY N/A 07/25/2023   Procedure: BRONCHOSCOPY, RIGID;  Surgeon: Artice Last, MD;  Location: [facility] OR;  Service: ENT;  Laterality: N/A;    Family History  Problem Relation Age of Onset   Asthma Mother    Cancer Maternal Grandmother    Hypertension Maternal Grandfather     Social History:  reports that she quit smoking about 18 years ago. Her smoking use included cigarettes. She has never used smokeless tobacco. She reports current alcohol use of about 5.0 standard drinks of alcohol per week. She reports that she does not use drugs.  Allergies: No Known Allergies  Medications: I have reviewed the patient's current medications.  The PMH, PSH, Medications, Allergies, and SH were reviewed and updated.  ROS: Constitutional: Negative for fever, weight loss and weight gain. Cardiovascular: Negative for chest pain and dyspnea on exertion. Respiratory: Is not experiencing shortness of breath at rest. Gastrointestinal: Negative for nausea and vomiting. Neurological: Negative for headaches. Psychiatric: The patient is not  nervous/anxious  Blood pressure (!) 134/95, pulse 62, last menstrual period 07/19/2023, SpO2 100%, unknown if currently breastfeeding.  PHYSICAL EXAM:  Exam: General: Well-developed, well-nourished Communication and Voice: good voice clarity today Respiratory Respiratory effort: Equal inspiration and expiration without stridor Cardiovascular Peripheral Vascular: Warm extremities with equal color/perfusion Eyes: No nystagmus with equal extraocular motion bilaterally Neuro/Psych/Balance: Patient oriented to person, place, and time; Appropriate mood and affect; Gait is intact with no imbalance; Cranial nerves I-XII are intact Head and Face Inspection: Normocephalic and atraumatic without mass or lesion Palpation: Facial skeleton intact without bony stepoffs Salivary Glands: No mass or tenderness Facial Strength: Facial motility symmetric and full bilaterally ENT Pinna: External ear intact and fully developed External canal: Canal is patent with intact skin Tympanic Membrane: Clear and mobile External Nose: No scar or anatomic deformity Internal Nose: Septum is deviated to the left. No polyp, or purulence. Mucosal edema and erythema present.  Bilateral inferior turbinate hypertrophy.  Lips, Teeth, and gums: Mucosa and teeth intact and viable TMJ: No pain to palpation with full mobility Oral cavity/oropharynx: No erythema or exudate, no lesions present Nasopharynx: No mass or lesion with intact mucosa Hypopharynx: Intact mucosa without pooling of secretions Larynx Glottic: Full true vocal cord mobility without lesion or mass edge of the R VF appears straight, good closure noted Supraglottic: Normal appearing epiglottis and AE folds Interarytenoid Space: Moderate pachydermia&edema Subglottic Space: Patent  without lesion or edema Neck Neck and Trachea: Midline trachea without mass or lesion Thyroid: No mass or nodularity Lymphatics: No lymphadenopathy  Procedure: Preoperative  diagnosis: hoarseness s/p R VF polyp excision   Postoperative diagnosis:   Same + GERD LPR and PND  Procedure: Flexible fiberoptic laryngoscopy  Surgeon: Artice Last, MD  Anesthesia: Topical lidocaine  and Afrin Complications: None Condition is stable throughout exam  Indications and consent:  The patient presents to the clinic with Indirect laryngoscopy view was incomplete. Thus it was recommended that they undergo a flexible fiberoptic laryngoscopy. All of the risks, benefits, and potential complications were reviewed with the patient preoperatively and verbal informed consent was obtained.  Procedure: The patient was seated upright in the clinic. Topical lidocaine  and Afrin were applied to the nasal cavity. After adequate anesthesia had occurred, I then proceeded to pass the flexible telescope into the nasal cavity. The nasal cavity was patent without rhinorrhea or polyp. The nasopharynx was also patent without mass or lesion. The base of tongue was visualized and was normal. There were no signs of pooling of secretions in the piriform sinuses. The true vocal folds were mobile bilaterally. There were no signs of glottic or supraglottic mucosal lesion or mass. There was moderate interarytenoid pachydermia and post cricoid edema. The telescope was then slowly withdrawn and the patient tolerated the procedure throughout.  Studies Reviewed:abdominal U/S 05/14/23 IMPRESSION: 1. Distended gallbladder with multiple polyps, with the largest measuring 0.5 cm. No biliary dilatation.   2.  Nonvisualization of the spleen.  Pathology 07/25/23 A. VOCAL CORD CYST, RIGHT MID, EXCISION:  Laryngeal polyp (vocal cord polyp).  Negative for dysplasia and malignancy.   Assessment/Plan: Encounter Diagnoses  Name Primary?   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 Lesion observed on the right vocal cord during flexible laryngoscopy. Differential includes benign thickening, polyp, or dysplasia. No signs of malignancy on visual inspection, but biopsy required for definitive diagnosis. Discussed potential causes including vocal strain/phonotrauma. Explained biopsy necessity for tissue diagnosis and improve voice quality. Procedure will be done under anesthesia as an outpatient. Informed about two days of voice rest post-procedure and avoiding other surgeries soon after to prevent intubation-related trauma. Lesion removal could improve voice quality by allowing proper vocal cord closure and vibration.   - Schedule outpatient procedure for vocal cord lesion removal (DML, bronch and microflap excision of the right VF lesion  - Advise two days of voice rest post-procedure   - Continue omeprazole for reflux management   - Avoid scheduling other surgeries soon after the vocal cord procedure to prevent intubation-related trauma    Chronic Gastroesophageal Reflux Disease (GERD)   Recent onset of upper chest discomfort and findings on scope exam c/w GERD LPR. Currently on omeprazole. Upper endoscopy scheduled with GI.  - Proceed with upper endoscopy on Wednesday at 10 AM   - Continue omeprazole as prescribed  Cervical lymphadenopathy  Hx of a small node left posterior neck triangle.  She reports she had a neck U/S which showed a sub-cm node in the area of concern. On exam today a sub-cm node palpated along left posterior aspect of the SCM. No increase in size.  No immediate concern unless growth or additional nodes appear. She is otherwise asymptomatic, and  denies weight changes, rashes, abnormal labs in the past. Explained that small nodes are common and usually benign unless they grow or have pathologic appearance on imaging.  - Monitor lymph node and repeat neck U/S in 6-12 months   Follow-up   - Surgery  schedulers to contact patient for vocal cord lesion removal procedure   - Follow up with results of upper endoscopy and biopsy.     Update 08/13/2023 Assessment and Plan Assessment & Plan Benign vocal cord cyst/polyp s/p excision, reports better voice, scope exam with healed surface of the VF.  Post-surgical site healed with improved voice clarity. Discussed importance of voice hygiene, and management of GERD LPR and PND . - Monitor voice improvement, consider speech therapy if needed in the future. - Advised voice rest, use strategies to warm up and lubricate vocal cords. - Instruct to inform anesthesiologist of prior vocal cord surgery and request smaller intubation tube for cholecystectomy.  Chronic nasal congestion Postnasal drip Suspected environmental allergies  Likely related to allergic rhinitis, not acid reflux. Control necessary to prevent voice issue exacerbation. - Prescribef Flonase nasal spray twice daily and Xyzal at night. - Consider allergy testing if symptoms persist.   Gastroesophageal reflux disease (GERD) LPR GERD may contribute to throat clearing and hoarseness. Managed through dietary modifications. - Advised continuation of dietary modifications, limit coffee and alcohol. - Recommend Reflux G[employer]met after meals  Cervical lymphadenopathy  Hx of a small node left posterior neck triangle.  She reports she had a neck U/S which showed a sub-cm node in the area of concern. On exam today a sub-cm node palpated along left posterior aspect of the SCM. No increase in size.  No immediate concern unless growth or additional nodes appear. She is otherwise asymptomatic, and denies weight changes, rashes, abnormal labs in the past. Explained that small nodes are common and usually benign unless they grow or have pathologic appearance on imaging.  - Monitor lymph node and repeat neck U/S in 6-12 months    Ok to proceed with gallbladder surgery end of June, advised smaller size ETT  to avoid VF trauma     Artice Last, MD Otolaryngology [facility] Phone: [phone] Fax: [phone]    08/13/2023, 4:14 PM

## 2023-08-17 ENCOUNTER — Other Ambulatory Visit (INDEPENDENT_AMBULATORY_CARE_PROVIDER_SITE_OTHER): Payer: Self-pay

## 2023-08-17 ENCOUNTER — Telehealth (INDEPENDENT_AMBULATORY_CARE_PROVIDER_SITE_OTHER): Payer: Self-pay

## 2023-08-17 NOTE — Telephone Encounter (Signed)
 Left a voicemail for patient to call back and that I would be glad to answer her questions.

## 2023-09-04 ENCOUNTER — Encounter (INDEPENDENT_AMBULATORY_CARE_PROVIDER_SITE_OTHER): Payer: Self-pay | Admitting: Otolaryngology

## 2023-09-10 ENCOUNTER — Telehealth (INDEPENDENT_AMBULATORY_CARE_PROVIDER_SITE_OTHER): Payer: Self-pay

## 2023-09-10 NOTE — Telephone Encounter (Signed)
 Patient left a message that she is having a upper endoscopy this Friday and it was recommended that a small scope be used, the doctor stated that the small scope will not allow him to see what was needed and to take a biopsy if they had to.  The patient wants to know if it will be ok to use regular scope

## 2023-09-10 NOTE — Telephone Encounter (Signed)
 I spoke with patient about upcoming procedure with her GI. She has clearance to proceed with the procedure as normal.

## 2023-09-27 ENCOUNTER — Other Ambulatory Visit (HOSPITAL_BASED_OUTPATIENT_CLINIC_OR_DEPARTMENT_OTHER): Payer: Self-pay | Admitting: Family Medicine

## 2023-09-27 DIAGNOSIS — N6310 Unspecified lump in the right breast, unspecified quadrant: Secondary | ICD-10-CM

## 2024-02-13 ENCOUNTER — Ambulatory Visit: Admission: RE | Admit: 2024-02-13 | Source: Ambulatory Visit

## 2024-02-13 ENCOUNTER — Ambulatory Visit
Admission: RE | Admit: 2024-02-13 | Discharge: 2024-02-13 | Disposition: A | Discharge status: Home / Self Care | Source: Ambulatory Visit | Attending: Family Medicine | Admitting: Family Medicine

## 2024-02-13 DIAGNOSIS — N6310 Unspecified lump in the right breast, unspecified quadrant: Secondary | ICD-10-CM
